# Patient Record
Sex: Male | Born: 1955
Health system: Southern US, Community
[De-identification: ages and names within clinical notes are randomized; demographics above are authoritative.]

## PROBLEM LIST (undated history)

## (undated) DIAGNOSIS — I10 Essential (primary) hypertension: Secondary | ICD-10-CM

## (undated) DIAGNOSIS — T7840XA Allergy, unspecified, initial encounter: Secondary | ICD-10-CM

## (undated) DIAGNOSIS — K819 Cholecystitis, unspecified: Secondary | ICD-10-CM

## (undated) DIAGNOSIS — E7800 Pure hypercholesterolemia, unspecified: Secondary | ICD-10-CM

## (undated) HISTORY — PX: CHOLECYSTECTOMY: SHX55

## (undated) HISTORY — PX: COLONOSCOPY: SHX174

## (undated) HISTORY — PX: COLONOSCOPY: SHX5424

## (undated) HISTORY — DX: Allergy, unspecified, initial encounter: T78.40XA

---

## 2016-11-12 DIAGNOSIS — K819 Cholecystitis, unspecified: Secondary | ICD-10-CM

## 2016-11-12 HISTORY — DX: Cholecystitis, unspecified: K81.9

## 2016-11-17 ENCOUNTER — Inpatient Hospital Stay (HOSPITAL_COMMUNITY): Payer: BLUE CROSS/BLUE SHIELD

## 2016-11-17 ENCOUNTER — Inpatient Hospital Stay (HOSPITAL_COMMUNITY)
Admission: EM | Admit: 2016-11-17 | Discharge: 2016-11-22 | DRG: 417 | Disposition: A | Discharge status: Home / Self Care | Payer: BLUE CROSS/BLUE SHIELD | Attending: Surgery | Admitting: Surgery

## 2016-11-17 ENCOUNTER — Emergency Department (HOSPITAL_COMMUNITY): Payer: BLUE CROSS/BLUE SHIELD

## 2016-11-17 ENCOUNTER — Encounter (HOSPITAL_COMMUNITY): Payer: Self-pay | Admitting: *Deleted

## 2016-11-17 DIAGNOSIS — R1011 Right upper quadrant pain: Secondary | ICD-10-CM | POA: Diagnosis present

## 2016-11-17 DIAGNOSIS — R188 Other ascites: Secondary | ICD-10-CM | POA: Diagnosis present

## 2016-11-17 DIAGNOSIS — I10 Essential (primary) hypertension: Secondary | ICD-10-CM | POA: Diagnosis present

## 2016-11-17 DIAGNOSIS — E7800 Pure hypercholesterolemia, unspecified: Secondary | ICD-10-CM | POA: Diagnosis present

## 2016-11-17 DIAGNOSIS — K8000 Calculus of gallbladder with acute cholecystitis without obstruction: Secondary | ICD-10-CM | POA: Diagnosis present

## 2016-11-17 DIAGNOSIS — K567 Ileus, unspecified: Secondary | ICD-10-CM | POA: Diagnosis not present

## 2016-11-17 DIAGNOSIS — R140 Abdominal distension (gaseous): Secondary | ICD-10-CM

## 2016-11-17 DIAGNOSIS — R7401 Elevation of levels of liver transaminase levels: Secondary | ICD-10-CM

## 2016-11-17 DIAGNOSIS — K8020 Calculus of gallbladder without cholecystitis without obstruction: Secondary | ICD-10-CM | POA: Diagnosis present

## 2016-11-17 DIAGNOSIS — K851 Biliary acute pancreatitis without necrosis or infection: Secondary | ICD-10-CM | POA: Diagnosis present

## 2016-11-17 DIAGNOSIS — R740 Nonspecific elevation of levels of transaminase and lactic acid dehydrogenase [LDH]: Secondary | ICD-10-CM

## 2016-11-17 DIAGNOSIS — Z5309 Procedure and treatment not carried out because of other contraindication: Secondary | ICD-10-CM

## 2016-11-17 HISTORY — DX: Cholecystitis, unspecified: K81.9

## 2016-11-17 HISTORY — DX: Pure hypercholesterolemia, unspecified: E78.00

## 2016-11-17 HISTORY — DX: Essential (primary) hypertension: I10

## 2016-11-17 LAB — URINALYSIS, ROUTINE W REFLEX MICROSCOPIC
BACTERIA UA: NONE SEEN
BILIRUBIN URINE: NEGATIVE
Glucose, UA: NEGATIVE mg/dL
HGB URINE DIPSTICK: NEGATIVE
KETONES UR: NEGATIVE mg/dL
LEUKOCYTES UA: NEGATIVE
NITRITE: NEGATIVE
PH: 7 (ref 5.0–8.0)
Protein, ur: 30 mg/dL — AB
SPECIFIC GRAVITY, URINE: 1.026 (ref 1.005–1.030)
SQUAMOUS EPITHELIAL / LPF: NONE SEEN

## 2016-11-17 LAB — COMPREHENSIVE METABOLIC PANEL
ALT: 222 U/L — ABNORMAL HIGH (ref 17–63)
ANION GAP: 9 (ref 5–15)
AST: 259 U/L — ABNORMAL HIGH (ref 15–41)
Albumin: 4.6 g/dL (ref 3.5–5.0)
Alkaline Phosphatase: 91 U/L (ref 38–126)
BILIRUBIN TOTAL: 2.5 mg/dL — AB (ref 0.3–1.2)
BUN: 17 mg/dL (ref 6–20)
CHLORIDE: 105 mmol/L (ref 101–111)
CO2: 24 mmol/L (ref 22–32)
Calcium: 9.2 mg/dL (ref 8.9–10.3)
Creatinine, Ser: 1.17 mg/dL (ref 0.61–1.24)
Glucose, Bld: 158 mg/dL — ABNORMAL HIGH (ref 65–99)
POTASSIUM: 4.1 mmol/L (ref 3.5–5.1)
Sodium: 138 mmol/L (ref 135–145)
TOTAL PROTEIN: 7 g/dL (ref 6.5–8.1)

## 2016-11-17 LAB — LIPASE, BLOOD: LIPASE: 41 U/L (ref 11–51)

## 2016-11-17 LAB — I-STAT TROPONIN, ED: TROPONIN I, POC: 0.01 ng/mL (ref 0.00–0.08)

## 2016-11-17 LAB — CBC
HEMATOCRIT: 45.5 % (ref 39.0–52.0)
Hemoglobin: 16.1 g/dL (ref 13.0–17.0)
MCH: 32.1 pg (ref 26.0–34.0)
MCHC: 35.4 g/dL (ref 30.0–36.0)
MCV: 90.6 fL (ref 78.0–100.0)
Platelets: 193 10*3/uL (ref 150–400)
RBC: 5.02 MIL/uL (ref 4.22–5.81)
RDW: 12.8 % (ref 11.5–15.5)
WBC: 10.3 10*3/uL (ref 4.0–10.5)

## 2016-11-17 MED ORDER — PROMETHAZINE HCL 25 MG/ML IJ SOLN
12.5000 mg | Freq: Once | INTRAMUSCULAR | Status: AC
  Administered 2016-11-18: 12.5 mg via INTRAVENOUS
  Filled 2016-11-17: qty 1

## 2016-11-17 MED ORDER — KCL IN DEXTROSE-NACL 20-5-0.45 MEQ/L-%-% IV SOLN
INTRAVENOUS | Status: DC
Start: 2016-11-17 — End: 2016-11-18
  Administered 2016-11-17 – 2016-11-18 (×2): via INTRAVENOUS
  Filled 2016-11-17 (×4): qty 1000

## 2016-11-17 MED ORDER — METOPROLOL TARTRATE 5 MG/5ML IV SOLN
5.0000 mg | Freq: Four times a day (QID) | INTRAVENOUS | Status: DC | PRN
  Administered 2016-11-21: 5 mg via INTRAVENOUS
  Filled 2016-11-17: qty 5

## 2016-11-17 MED ORDER — ACETAMINOPHEN 325 MG PO TABS
650.0000 mg | ORAL_TABLET | Freq: Four times a day (QID) | ORAL | Status: DC | PRN
  Administered 2016-11-17 – 2016-11-21 (×5): 650 mg via ORAL
  Filled 2016-11-17 (×5): qty 2

## 2016-11-17 MED ORDER — ONDANSETRON HCL 4 MG/2ML IJ SOLN
4.0000 mg | Freq: Four times a day (QID) | INTRAMUSCULAR | Status: DC | PRN
  Administered 2016-11-17: 4 mg via INTRAVENOUS
  Filled 2016-11-17: qty 2

## 2016-11-17 MED ORDER — PANTOPRAZOLE SODIUM 40 MG IV SOLR
40.0000 mg | Freq: Every day | INTRAVENOUS | Status: DC
  Administered 2016-11-17: 40 mg via INTRAVENOUS
  Filled 2016-11-17: qty 40

## 2016-11-17 MED ORDER — ONDANSETRON 4 MG PO TBDP: 4.0000 mg | ORAL_TABLET | Freq: Four times a day (QID) | ORAL | Status: DC | PRN

## 2016-11-17 MED ORDER — MORPHINE SULFATE (PF) 4 MG/ML IV SOLN
2.0000 mg | INTRAVENOUS | Status: DC | PRN
  Administered 2016-11-17 – 2016-11-20 (×23): 4 mg via INTRAVENOUS
  Administered 2016-11-20: 2 mg via INTRAVENOUS
  Administered 2016-11-20 – 2016-11-21 (×6): 4 mg via INTRAVENOUS
  Filled 2016-11-17 (×31): qty 1

## 2016-11-17 MED ORDER — SODIUM CHLORIDE 0.9 % IV BOLUS (SEPSIS)
1000.0000 mL | Freq: Once | INTRAVENOUS | Status: AC
  Administered 2016-11-17: 1000 mL via INTRAVENOUS

## 2016-11-17 MED ORDER — ACETAMINOPHEN 650 MG RE SUPP: 650.0000 mg | Freq: Four times a day (QID) | RECTAL | Status: DC | PRN

## 2016-11-17 MED ORDER — MORPHINE SULFATE (PF) 4 MG/ML IV SOLN
4.0000 mg | Freq: Once | INTRAVENOUS | Status: AC
  Administered 2016-11-17: 4 mg via INTRAVENOUS
  Filled 2016-11-17: qty 1

## 2016-11-17 MED ORDER — ONDANSETRON HCL 4 MG/2ML IJ SOLN
4.0000 mg | Freq: Once | INTRAMUSCULAR | Status: AC
  Administered 2016-11-17: 4 mg via INTRAVENOUS
  Filled 2016-11-17: qty 2

## 2016-11-17 NOTE — H&P (Signed)
[facility] Admission Note  Tracy Smith 1955-06-03  [id number].    Requesting MD: Julianne Rice MD Chief Complaint/Reason for Consult: Abdominal pain HPI:  Patient is a 61 y.o. Male with PMH significant for HTN and HLD who presented to [facility] with abdominal pain, nausea and vomiting this AM. He has been having episodes of abdominal pain after eating for the last 2-3 mos. Over the last 6 weeks it has been happening more often and the pain has been lasting longer when it happens. Patient describes pain as sharp and radiating from the midepigastric area to RUQ. Has been taking nexium to try to relieve symptoms without success. Notices pain is usually after eating a meal out. Associated nausea, vomiting, diaphoresis. Denies fever, chills, heartburn, hematemesis, diarrhea, constipation, weakness, fatigue, weight loss. Patient reports occasional alcohol use (1-2 drinks every few weeks), never smoked, no illicit drug use. No allergies to any medications. Last time patient had anything to eat or drink was around 9 PM last night. Currently patient is without pain or nausea.   Patient does not report any previous abdominal surgeries. Not on any blood thinning medications.   Patient did report some chest pain on presenting to ED, cardiac workup in the ED was negative .   ROS: Review of Systems  Constitutional: Positive for diaphoresis. Negative for chills, fever, malaise/fatigue and weight loss.  Respiratory: Negative for shortness of breath and wheezing.   Cardiovascular: Positive for chest pain. Negative for palpitations and leg swelling.  Gastrointestinal: Positive for abdominal pain (mid epigastric and RUQ), nausea and vomiting. Negative for blood in stool, constipation, diarrhea, heartburn and melena.  Genitourinary: Negative for dysuria, frequency and urgency.  Skin: Negative for itching and rash.  Neurological: Negative for dizziness and loss of consciousness.  All other systems  reviewed and are negative.   No family history on file.  Past Medical History:  Diagnosis Date  . High cholesterol   . Hypertension     History reviewed. No pertinent surgical history.  Social History:  reports that he has never smoked. He has never used smokeless tobacco. He reports that he does not drink alcohol or use drugs.  Allergies: No Known Allergies   (Not in a hospital admission)  Blood pressure 105/87, pulse 67, temperature 98.2 F (36.8 C), temperature source Oral, resp. rate 18, height 5' 8.5" (1.74 m), weight 96.2 kg (212 lb), SpO2 97 %. Physical Exam: Physical Exam  Constitutional: He is oriented to person, place, and time. He appears well-developed and well-nourished. He is cooperative.  Non-toxic appearance. No distress.  HENT:  Head: Normocephalic and atraumatic.  Right Ear: External ear normal.  Left Ear: External ear normal.  Nose: Nose normal.  Mouth/Throat: Oropharynx is clear and moist and mucous membranes are normal.  Eyes: Conjunctivae and lids are normal. No scleral icterus.  Pupils equal and round.   Neck: Trachea normal and normal range of motion. Neck supple.  Cardiovascular: Normal rate, regular rhythm, S1 normal and S2 normal.   Pulses:      Radial pulses are 2+ on the right side, and 2+ on the left side.  Pulmonary/Chest: Effort normal and breath sounds normal. He has no decreased breath sounds. He has no wheezes. He has no rhonchi. He has no rales.  Abdominal: Soft. Normal appearance and bowel sounds are normal. He exhibits no shifting dullness and no distension. There is no hepatosplenomegaly. There is no tenderness. There is no rigidity, no rebound, no guarding and negative Murphy's sign. No  hernia.  Musculoskeletal: Normal range of motion.  No gross deformities or edema  Neurological: He is alert and oriented to person, place, and time. He has normal strength. No sensory deficit.  Skin: Skin is warm, dry and intact. He is not diaphoretic.  No pallor.  Psychiatric: He has a normal mood and affect. His behavior is normal. Judgment normal.    Results for orders placed or performed during the hospital encounter of 11/17/16 (from the past 48 hour(s))  Urinalysis, Routine w reflex microscopic     Status: Abnormal   Collection Time: 11/17/16  8:39 AM  Result Value Ref Range   Color, Urine AMBER (A) YELLOW    Comment: BIOCHEMICALS MAY BE AFFECTED BY COLOR   APPearance CLEAR CLEAR   Specific Gravity, Urine 1.026 1.005 - 1.030   pH 7.0 5.0 - 8.0   Glucose, UA NEGATIVE NEGATIVE mg/dL   Hgb urine dipstick NEGATIVE NEGATIVE   Bilirubin Urine NEGATIVE NEGATIVE   Ketones, ur NEGATIVE NEGATIVE mg/dL   Protein, ur 30 (A) NEGATIVE mg/dL   Nitrite NEGATIVE NEGATIVE   Leukocytes, UA NEGATIVE NEGATIVE   RBC / HPF 0-5 0 - 5 RBC/hpf   WBC, UA 0-5 0 - 5 WBC/hpf   Bacteria, UA NONE SEEN NONE SEEN   Squamous Epithelial / LPF NONE SEEN NONE SEEN   Mucous PRESENT   Lipase, blood     Status: None   Collection Time: 11/17/16  9:02 AM  Result Value Ref Range   Lipase 41 11 - 51 U/L  Comprehensive metabolic panel     Status: Abnormal   Collection Time: 11/17/16  9:02 AM  Result Value Ref Range   Sodium 138 135 - 145 mmol/L   Potassium 4.1 3.5 - 5.1 mmol/L   Chloride 105 101 - 111 mmol/L   CO2 24 22 - 32 mmol/L   Glucose, Bld 158 (H) 65 - 99 mg/dL   BUN 17 6 - 20 mg/dL   Creatinine, Ser 1.17 0.61 - 1.24 mg/dL   Calcium 9.2 8.9 - 10.3 mg/dL   Total Protein 7.0 6.5 - 8.1 g/dL   Albumin 4.6 3.5 - 5.0 g/dL   AST 259 (H) 15 - 41 U/L   ALT 222 (H) 17 - 63 U/L   Alkaline Phosphatase 91 38 - 126 U/L   Total Bilirubin 2.5 (H) 0.3 - 1.2 mg/dL   GFR calc non Af Amer >60 >60 mL/min   GFR calc Af Amer >60 >60 mL/min    Comment: (NOTE) The eGFR has been calculated using the CKD EPI equation. This calculation has not been validated in all clinical situations. eGFR's persistently <60 mL/min signify possible Chronic Kidney Disease.    Anion gap 9  5 - 15  CBC     Status: None   Collection Time: 11/17/16  9:02 AM  Result Value Ref Range   WBC 10.3 4.0 - 10.5 K/uL   RBC 5.02 4.22 - 5.81 MIL/uL   Hemoglobin 16.1 13.0 - 17.0 g/dL   HCT 45.5 39.0 - 52.0 %   MCV 90.6 78.0 - 100.0 fL   MCH 32.1 26.0 - 34.0 pg   MCHC 35.4 30.0 - 36.0 g/dL   RDW 12.8 11.5 - 15.5 %   Platelets 193 150 - 400 K/uL  I-stat troponin, ED     Status: None   Collection Time: 11/17/16  9:08 AM  Result Value Ref Range   Troponin i, poc 0.01 0.00 - 0.08 ng/mL   Comment 3  Comment: Due to the release kinetics of cTnI, a negative result within the first hours of the onset of symptoms does not rule out myocardial infarction with certainty. If myocardial infarction is still suspected, repeat the test at appropriate intervals.    US Abdomen Limited  Result Date: 11/17/2016 CLINICAL DATA:  Right upper quadrant pain. EXAM: ULTRASOUND ABDOMEN LIMITED RIGHT UPPER QUADRANT COMPARISON:  None. FINDINGS: Gallbladder: The gallbladder is normally distended. There is layering gallbladder sludge. The gallbladder wall thickness measures 2 mm, which is normal. However there is a suggestion of tiny amount of pericholecystic fluid. This sonographic Murphy's sign was recorded is negative. Common bile duct: Diameter: 6 mm Liver: No focal lesion identified. Diffusely heterogeneous echotexture of the liver. IMPRESSION: Gallbladder sludge without evidence of abnormal distention or gallbladder wall thickening. There is however a suggestion of tiny amount of pericholecystic fluid, which may indicate early inflammatory changes. Sonographic Murphy's sign was recorded is negative. Diffusely heterogeneous echotexture of the liver, usually associated with hepatic fibrosis. Electronically Signed   By: Fidela Salisbury M.D.   On: 11/17/2016 10:03      Assessment/Plan Symptomatic Cholelithiasis w/ elevated LFTs  - [place]: GB sludge without evidence of acute cholecystitis - AST/ALT:  259/222; Tbili: 2.5 - Patient is afebrile and clinical exam unimpressive, WBC 10.3 - not starting on abx at this point - Consulted GI for consideration of MRCP - Admit, NPO, IVF, pain control - will plan for laparoscopic cholecystectomy pending results of MRCP  Brigid Re, [facility] 11/17/2016, 11:19 AM Pager: [phone] Consults: [phone] Mon-Fri 7:00 am-4:30 pm Sat-Sun 7:00 am-11:30 am

## 2016-11-17 NOTE — ED Provider Notes (Signed)
[facility] DEPT Provider Note   CSN: [id number] Arrival date & time: 11/17/16  0814     History   Chief Complaint No chief complaint on file.   HPI Tracy Smith is a 61 y.o. male.  HPI Patient presents with several weeks of episodic upper abdominal pain. Seems to be associated with eating out, mostly burgers and fries. Associated with nausea and vomiting. Patient has been on Nexium prescribed by primary physician. States he ate [place] food last night. He woke up at 1 AM with severe upper abdominal pain radiating to his back. States he was doubled over in pain. Endorse nausea and one episode of vomiting. There was no gross blood or coffee-ground substance in the emesis. Had a normal bowel movement last night without gross blood or melanotic stools. States the pain is improved significantly. Now rates pain a 3/10. No previous abdominal surgeries. Denies any chest pain or shortness of breath. Denies NSAID use or drinking alcohol. Past Medical History:  Diagnosis Date  . High cholesterol   . Hypertension     Patient Active Problem List   Diagnosis Date Noted  . Symptomatic cholelithiasis 11/17/2016    History reviewed. No pertinent surgical history.     Home Medications    Prior to Admission medications   Medication Sig Start Date End Date Taking? Authorizing Provider  amLODipine-benazepril (LOTREL) 5-40 MG capsule Take 1 capsule by mouth every evening.   Yes [provider]  aspirin EC 81 MG tablet Take 81 mg by mouth every evening.    Yes [provider]  esomeprazole (NEXIUM) 40 MG capsule Take 40 mg by mouth every morning.    Yes [provider]  montelukast (SINGULAIR) 10 MG tablet Take 10 mg by mouth every evening.   Yes [provider]  simvastatin (ZOCOR) 20 MG tablet Take 20 mg by mouth every evening.    Yes [provider]    Family History No family history on file.  Social History Social History  Substance Use  Topics  . Smoking status: Never Smoker  . Smokeless tobacco: Never Used  . Alcohol use No     Allergies   Patient has no known allergies.   Review of Systems Review of Systems  Constitutional: Positive for diaphoresis. Negative for chills and fever.  Respiratory: Negative for shortness of breath.   Cardiovascular: Negative for chest pain, palpitations and leg swelling.  Gastrointestinal: Positive for abdominal pain, nausea and vomiting. Negative for blood in stool, constipation and diarrhea.  Genitourinary: Negative for dysuria, flank pain, frequency and hematuria.  Musculoskeletal: Positive for back pain. Negative for myalgias, neck pain and neck stiffness.  Skin: Negative for rash and wound.  Neurological: Negative for dizziness, weakness, light-headedness, numbness and headaches.  All other systems reviewed and are negative.    Physical Exam Updated Vital Signs BP 112/61 (BP Location: Right Arm)   Pulse 70   Temp 98.2 F (36.8 C) (Oral)   Resp 19   Ht 5' 8.5" (1.74 m)   Wt 96.2 kg (212 lb)   SpO2 95%   BMI 31.77 kg/m   Physical Exam  Constitutional: He is oriented to person, place, and time. He appears well-developed and well-nourished. No distress.  HENT:  Head: Normocephalic and atraumatic.  Mouth/Throat: Oropharynx is clear and moist. No oropharyngeal exudate.  Eyes: EOM are normal. Pupils are equal, round, and reactive to light.  Neck: Normal range of motion. Neck supple.  Cardiovascular: Normal rate and regular rhythm.  Exam  reveals no gallop and no friction rub.   No murmur heard. Pulmonary/Chest: Effort normal and breath sounds normal. No respiratory distress. He has no wheezes. He has no rales. He exhibits no tenderness.  Abdominal: Soft. Bowel sounds are normal. There is tenderness. There is no rebound and no guarding.  Patient has right upper quadrant and epigastric tenderness with palpation. There is no rebound or guarding.  Musculoskeletal: Normal  range of motion. He exhibits no edema or tenderness.  No CVA tenderness bilaterally. No midline thoracic or lumbar tenderness.  Neurological: He is alert and oriented to person, place, and time.    Moving all extremeties without deficit. Sensation fully intact.  Skin: Skin is warm and dry. No rash noted. No erythema.  Psychiatric: He has a normal mood and affect. His behavior is normal.  Nursing note and vitals reviewed.    ED Treatments / Results  Labs (all labs ordered are listed, but only abnormal results are displayed) Labs Reviewed  COMPREHENSIVE METABOLIC PANEL - Abnormal; Notable for the following:       Result Value   Glucose, Bld 158 (*)    AST 259 (*)    ALT 222 (*)    Total Bilirubin 2.5 (*)    All other components within normal limits  URINALYSIS, ROUTINE W REFLEX MICROSCOPIC - Abnormal; Notable for the following:    Color, Urine AMBER (*)    Protein, ur 30 (*)    All other components within normal limits  LIPASE, BLOOD  CBC  HIV ANTIBODY (ROUTINE TESTING)  I-STAT TROPOININ, ED    EKG  EKG Interpretation  Date/Time:  Friday November 17 2016 08:19:55 EDT Ventricular Rate:  76 PR Interval:  158 QRS Duration: 80 QT Interval:  372 QTC Calculation: 418 R Axis:   34 Text Interpretation:  Normal sinus rhythm Low voltage QRS Inferior infarct , age undetermined Abnormal ECG Confirmed by Lita Mains  MD, Selenne Coggin ([id number]) on 11/17/2016 8:37:45 AM       Radiology US Abdomen Limited  Result Date: 11/17/2016 CLINICAL DATA:  Right upper quadrant pain. EXAM: ULTRASOUND ABDOMEN LIMITED RIGHT UPPER QUADRANT COMPARISON:  None. FINDINGS: Gallbladder: The gallbladder is normally distended. There is layering gallbladder sludge. The gallbladder wall thickness measures 2 mm, which is normal. However there is a suggestion of tiny amount of pericholecystic fluid. This sonographic Murphy's sign was recorded is negative. Common bile duct: Diameter: 6 mm Liver: No focal lesion identified.  Diffusely heterogeneous echotexture of the liver. IMPRESSION: Gallbladder sludge without evidence of abnormal distention or gallbladder wall thickening. There is however a suggestion of tiny amount of pericholecystic fluid, which may indicate early inflammatory changes. Sonographic Murphy's sign was recorded is negative. Diffusely heterogeneous echotexture of the liver, usually associated with hepatic fibrosis. Electronically Signed   By: Fidela Salisbury M.D.   On: 11/17/2016 10:03    Procedures Procedures (including critical care time)  Medications Ordered in ED Medications  dextrose 5 % and 0.45 % NaCl with KCl 20 mEq/L infusion (not administered)  acetaminophen (TYLENOL) tablet 650 mg (not administered)    Or  acetaminophen (TYLENOL) suppository 650 mg (not administered)  morphine 4 MG/ML injection 2-4 mg (not administered)  ondansetron (ZOFRAN-ODT) disintegrating tablet 4 mg (not administered)    Or  ondansetron (ZOFRAN) injection 4 mg (not administered)  pantoprazole (PROTONIX) injection 40 mg (not administered)  metoprolol tartrate (LOPRESSOR) injection 5 mg (not administered)  morphine 4 MG/ML injection 4 mg (4 mg Intravenous Given 11/17/16 0907)  ondansetron (ZOFRAN) injection  4 mg (4 mg Intravenous Given 11/17/16 0907)  sodium chloride 0.9 % bolus 1,000 mL (0 mLs Intravenous Stopped 11/17/16 1030)     Initial Impression / Assessment and Plan / ED Course  I have reviewed the triage vital signs and the nursing notes.  Pertinent labs & imaging results that were available during my care of the patient were reviewed by me and considered in my medical decision making (see chart for details).    Discussed with general surgery who will see patient in the emergency department and admit.   Final Clinical Impressions(s) / ED Diagnoses   Final diagnoses:  Symptomatic cholelithiasis    New Prescriptions New Prescriptions   No medications on file     Julianne Rice,  MD 11/17/16 1252

## 2016-11-17 NOTE — ED Triage Notes (Addendum)
Pt in c/o mid upper & lower constant abd pain onset 1 am today, reports x 1 vomiting episode, denies diarrhea, hx of similar symptoms recently, pt placed on Nexium by PCP, A&O x4

## 2016-11-17 NOTE — ED Notes (Signed)
Pt aware that urine sample is needed.  

## 2016-11-17 NOTE — Consult Note (Signed)
[facility] Consult Note  Referring Provider: No ref. provider found Primary Care Physician:  Patient, No Pcp Per Primary Gastroenterologist:  Dr.  Chief Complaint: epigastric abdominal pain HPI: Tracy Smith is an 61 y.o. white male  presents to the emergency room after developed excruciating epigastric pain with some radiation to the back and chest after eating cheeseburger and an Chinese food yesterday. He had several less severe episodes over the last 3 or 4 months for which he saw his primary physician and was given Nexium. She came to the emergency room where was found to have sludge since gallbladder pericholecystic fluid and elevated transaminases. We are asked consult to consider possibility of common bile duct stones in addition to gallstones  Past Medical History:  Diagnosis Date  . High cholesterol   . Hypertension     History reviewed. No pertinent surgical history.   (Not in a hospital admission)  Allergies: No Known Allergies  No family history on file.  Social History:  reports that he has never smoked. He has never used smokeless tobacco. He reports that he does not drink alcohol or use drugs.  Review of Systems: negative except As above   Blood pressure 112/61, pulse 70, temperature 98.2 F (36.8 C), temperature source Oral, resp. rate 19, height 5' 8.5" (1.74 m), weight 96.2 kg (212 lb), SpO2 95 %. Head: Normocephalic, without obvious abnormality, atraumatic Neck: no adenopathy, no carotid bruit, no JVD, supple, symmetrical, trachea midline and thyroid not enlarged, symmetric, no tenderness/mass/nodules Resp: clear to auscultation bilaterally Cardio: regular rate and rhythm, S1, S2 normal, no murmur, click, rub or gallop GI: Abdomen soft slightly distended with normoactive bowel sounds. No hepatomegaly masses or guarding. Extremities: extremities normal, atraumatic, no cyanosis or edema  Results for orders placed or performed during the hospital  encounter of 11/17/16 (from the past 48 hour(s))  Urinalysis, Routine w reflex microscopic     Status: Abnormal   Collection Time: 11/17/16  8:39 AM  Result Value Ref Range   Color, Urine AMBER (A) YELLOW    Comment: BIOCHEMICALS MAY BE AFFECTED BY COLOR   APPearance CLEAR CLEAR   Specific Gravity, Urine 1.026 1.005 - 1.030   pH 7.0 5.0 - 8.0   Glucose, UA NEGATIVE NEGATIVE mg/dL   Hgb urine dipstick NEGATIVE NEGATIVE   Bilirubin Urine NEGATIVE NEGATIVE   Ketones, ur NEGATIVE NEGATIVE mg/dL   Protein, ur 30 (A) NEGATIVE mg/dL   Nitrite NEGATIVE NEGATIVE   Leukocytes, UA NEGATIVE NEGATIVE   RBC / HPF 0-5 0 - 5 RBC/hpf   WBC, UA 0-5 0 - 5 WBC/hpf   Bacteria, UA NONE SEEN NONE SEEN   Squamous Epithelial / LPF NONE SEEN NONE SEEN   Mucous PRESENT   Lipase, blood     Status: None   Collection Time: 11/17/16  9:02 AM  Result Value Ref Range   Lipase 41 11 - 51 U/L  Comprehensive metabolic panel     Status: Abnormal   Collection Time: 11/17/16  9:02 AM  Result Value Ref Range   Sodium 138 135 - 145 mmol/L   Potassium 4.1 3.5 - 5.1 mmol/L   Chloride 105 101 - 111 mmol/L   CO2 24 22 - 32 mmol/L   Glucose, Bld 158 (H) 65 - 99 mg/dL   BUN 17 6 - 20 mg/dL   Creatinine, Ser 1.17 0.61 - 1.24 mg/dL   Calcium 9.2 8.9 - 10.3 mg/dL   Total Protein 7.0 6.5 - 8.1 g/dL     Albumin 4.6 3.5 - 5.0 g/dL   AST 259 (H) 15 - 41 U/L   ALT 222 (H) 17 - 63 U/L   Alkaline Phosphatase 91 38 - 126 U/L   Total Bilirubin 2.5 (H) 0.3 - 1.2 mg/dL   GFR calc non Af Amer >60 >60 mL/min   GFR calc Af Amer >60 >60 mL/min    Comment: (NOTE) The eGFR has been calculated using the CKD EPI equation. This calculation has not been validated in all clinical situations. eGFR's persistently <60 mL/min signify possible Chronic Kidney Disease.    Anion gap 9 5 - 15  CBC     Status: None   Collection Time: 11/17/16  9:02 AM  Result Value Ref Range   WBC 10.3 4.0 - 10.5 K/uL   RBC 5.02 4.22 - 5.81 MIL/uL   Hemoglobin  16.1 13.0 - 17.0 g/dL   HCT 45.5 39.0 - 52.0 %   MCV 90.6 78.0 - 100.0 fL   MCH 32.1 26.0 - 34.0 pg   MCHC 35.4 30.0 - 36.0 g/dL   RDW 12.8 11.5 - 15.5 %   Platelets 193 150 - 400 K/uL  I-stat troponin, ED     Status: None   Collection Time: 11/17/16  9:08 AM  Result Value Ref Range   Troponin i, poc 0.01 0.00 - 0.08 ng/mL   Comment 3            Comment: Due to the release kinetics of cTnI, a negative result within the first hours of the onset of symptoms does not rule out myocardial infarction with certainty. If myocardial infarction is still suspected, repeat the test at appropriate intervals.    US Abdomen Limited  Result Date: 11/17/2016 CLINICAL DATA:  Right upper quadrant pain. EXAM: ULTRASOUND ABDOMEN LIMITED RIGHT UPPER QUADRANT COMPARISON:  None. FINDINGS: Gallbladder: The gallbladder is normally distended. There is layering gallbladder sludge. The gallbladder wall thickness measures 2 mm, which is normal. However there is a suggestion of tiny amount of pericholecystic fluid. This sonographic Murphy's sign was recorded is negative. Common bile duct: Diameter: 6 mm Liver: No focal lesion identified. Diffusely heterogeneous echotexture of the liver. IMPRESSION: Gallbladder sludge without evidence of abnormal distention or gallbladder wall thickening. There is however a suggestion of tiny amount of pericholecystic fluid, which may indicate early inflammatory changes. Sonographic Murphy's sign was recorded is negative. Diffusely heterogeneous echotexture of the liver, usually associated with hepatic fibrosis. Electronically Signed   By: Fidela Salisbury M.D.   On: 11/17/2016 10:03    Assessment: 1. Gall Bladder sludge with possible cholecystitis 2. Elevated LFTs raising the possibility of common bile duct stones. Plan:  We'll obtain MRCP and recheck liver function tests in the morning and determine whether ERCP warranted. We'll follow with you. Toluwanimi Radebaugh C 11/17/2016, 11:57  AM  Pager [phone] If no answer or after 5 PM call [phone]

## 2016-11-17 NOTE — ED Notes (Signed)
Pt ambulatory to restroom

## 2016-11-17 NOTE — ED Notes (Signed)
Started taking Nexium 6 weeks ago (40mg ) and changed diet, but started having abd pain again last night, nauseated, had normal bowel movement last night, vomited this morning. Now pain is 3-4/10. Has had pain epigastric area in past.  Took kaopectate on Tuesday or Wednesday for similar discomfort--  Became diaphoretic during episode approx 3am. Last meal, last night, chinese food.

## 2016-11-17 NOTE — Progress Notes (Signed)
Assumed care of patient at 1600 after receiving handoff report from previous nurse. Patient has been experiencing 2-3 bouts of nausea and vomiting.patient was medicated for pain and nausea (see emar). Currently resting in bed. Asked to call nurse for any concerns.

## 2016-11-18 LAB — COMPREHENSIVE METABOLIC PANEL
ALBUMIN: 3.8 g/dL (ref 3.5–5.0)
ALK PHOS: 105 U/L (ref 38–126)
ALT: 354 U/L — ABNORMAL HIGH (ref 17–63)
ALT: 344 U/L — ABNORMAL HIGH (ref 17–63)
ANION GAP: 10 (ref 5–15)
AST: 223 U/L — AB (ref 15–41)
AST: 265 U/L — AB (ref 15–41)
Albumin: 3.7 g/dL (ref 3.5–5.0)
Alkaline Phosphatase: 104 U/L (ref 38–126)
Anion gap: 8 (ref 5–15)
BILIRUBIN TOTAL: 3.9 mg/dL — AB (ref 0.3–1.2)
BUN: 19 mg/dL (ref 6–20)
BUN: 17 mg/dL (ref 6–20)
CALCIUM: 8.6 mg/dL — AB (ref 8.9–10.3)
CHLORIDE: 104 mmol/L (ref 101–111)
CO2: 24 mmol/L (ref 22–32)
CO2: 22 mmol/L (ref 22–32)
Calcium: 8.6 mg/dL — ABNORMAL LOW (ref 8.9–10.3)
Chloride: 105 mmol/L (ref 101–111)
Creatinine, Ser: 1.43 mg/dL — ABNORMAL HIGH (ref 0.61–1.24)
Creatinine, Ser: 1.37 mg/dL — ABNORMAL HIGH (ref 0.61–1.24)
GFR calc Af Amer: >60 mL/min (ref >60)
GFR calc Af Amer: >60 mL/min (ref >60)
GFR calc non Af Amer: 52 mL/min — ABNORMAL LOW (ref >60)
GFR, EST NON AFRICAN AMERICAN: 55 mL/min — AB (ref >60)
GLUCOSE: 187 mg/dL — AB (ref 65–99)
GLUCOSE: 219 mg/dL — AB (ref 65–99)
POTASSIUM: 4.7 mmol/L (ref 3.5–5.1)
POTASSIUM: 4.4 mmol/L (ref 3.5–5.1)
SODIUM: 136 mmol/L (ref 135–145)
Sodium: 137 mmol/L (ref 135–145)
TOTAL PROTEIN: 6.1 g/dL — AB (ref 6.5–8.1)
TOTAL PROTEIN: 6.3 g/dL — AB (ref 6.5–8.1)
Total Bilirubin: 5.1 mg/dL — ABNORMAL HIGH (ref 0.3–1.2)

## 2016-11-18 LAB — CBC
HEMATOCRIT: 46.9 % (ref 39.0–52.0)
Hemoglobin: 16.5 g/dL (ref 13.0–17.0)
MCH: 32.1 pg (ref 26.0–34.0)
MCHC: 35.2 g/dL (ref 30.0–36.0)
MCV: 91.2 fL (ref 78.0–100.0)
PLATELETS: 192 10*3/uL (ref 150–400)
RBC: 5.14 MIL/uL (ref 4.22–5.81)
RDW: 13.1 % (ref 11.5–15.5)
WBC: 20 10*3/uL — AB (ref 4.0–10.5)

## 2016-11-18 LAB — HIV ANTIBODY (ROUTINE TESTING W REFLEX): HIV Screen 4th Generation wRfx: NONREACTIVE

## 2016-11-18 LAB — TRIGLYCERIDES: Triglycerides: 67 mg/dL (ref <150)

## 2016-11-18 LAB — LIPASE, BLOOD: Lipase: 824 U/L — ABNORMAL HIGH (ref 11–51)

## 2016-11-18 MED ORDER — PANTOPRAZOLE SODIUM 40 MG IV SOLR
40.0000 mg | Freq: Two times a day (BID) | INTRAVENOUS | Status: DC
  Administered 2016-11-18 – 2016-11-21 (×7): 40 mg via INTRAVENOUS
  Filled 2016-11-18 (×7): qty 40

## 2016-11-18 MED ORDER — PIPERACILLIN-TAZOBACTAM 3.375 G IVPB
3.3750 g | Freq: Three times a day (TID) | INTRAVENOUS | Status: DC
  Administered 2016-11-18 – 2016-11-22 (×12): 3.375 g via INTRAVENOUS
  Filled 2016-11-18 (×14): qty 50

## 2016-11-18 MED ORDER — SODIUM CHLORIDE 0.9 % IV SOLN
INTRAVENOUS | Status: DC
  Administered 2016-11-18 – 2016-11-21 (×13): via INTRAVENOUS

## 2016-11-18 NOTE — Progress Notes (Signed)
[facility] Progress Note  Subjective: Worsening pain last night with some  No vomiting  Objective: Vital signs in last 24 hours: Temp:  [98.2 F (36.8 C)-98.6 F (37 C)] 98.5 F (36.9 C) (07/07 0635) Pulse Rate:  [65-78] 68 (07/07 0635) Resp:  [14-21] 18 (07/07 6767) BP: (97-147)/(61-87) 131/71 (07/07 0635) SpO2:  [95 %-98 %] 97 % (07/07 0635) Weight change:    MC:NOBSJGG a little more distended and  Lab Results: Results for orders placed or performed during the hospital encounter of 11/17/16 (from the past 24 hour(s))  HIV antibody (Routine Testing)     Status: None   Collection Time: 11/17/16  1:33 PM  Result Value Ref Range   HIV Screen 4th Generation wRfx Non Reactive Non Reactive  Comprehensive metabolic panel     Status: Abnormal   Collection Time: 11/18/16 12:24 AM  Result Value Ref Range   Sodium 136 135 - 145 mmol/L   Potassium 4.4 3.5 - 5.1 mmol/L   Chloride 104 101 - 111 mmol/L   CO2 22 22 - 32 mmol/L   Glucose, Bld 219 (H) 65 - 99 mg/dL   BUN 17 6 - 20 mg/dL   Creatinine, Ser 1.43 (H) 0.61 - 1.24 mg/dL   Calcium 8.6 (L) 8.9 - 10.3 mg/dL   Total Protein 6.3 (L) 6.5 - 8.1 g/dL   Albumin 3.8 3.5 - 5.0 g/dL   AST 265 (H) 15 - 41 U/L   ALT 354 (H) 17 - 63 U/L   Alkaline Phosphatase 104 38 - 126 U/L   Total Bilirubin 3.9 (H) 0.3 - 1.2 mg/dL   GFR calc non Af Amer 52 (L) >60 mL/min   GFR calc Af Amer >60 >60 mL/min   Anion gap 10 5 - 15  Comprehensive metabolic panel     Status: Abnormal   Collection Time: 11/18/16  4:33 AM  Result Value Ref Range   Sodium 137 135 - 145 mmol/L   Potassium 4.7 3.5 - 5.1 mmol/L   Chloride 105 101 - 111 mmol/L   CO2 24 22 - 32 mmol/L   Glucose, Bld 187 (H) 65 - 99 mg/dL   BUN 19 6 - 20 mg/dL   Creatinine, Ser 1.37 (H) 0.61 - 1.24 mg/dL   Calcium 8.6 (L) 8.9 - 10.3 mg/dL   Total Protein 6.1 (L) 6.5 - 8.1 g/dL   Albumin 3.7 3.5 - 5.0 g/dL   AST 223 (H) 15 - 41 U/L   ALT 344 (H) 17 - 63 U/L   Alkaline  Phosphatase 105 38 - 126 U/L   Total Bilirubin 5.1 (H) 0.3 - 1.2 mg/dL   GFR calc non Af Amer 55 (L) >60 mL/min   GFR calc Af Amer >60 >60 mL/min   Anion gap 8 5 - 15  CBC     Status: Abnormal   Collection Time: 11/18/16  4:33 AM  Result Value Ref Range   WBC 20.0 (H) 4.0 - 10.5 K/uL   RBC 5.14 4.22 - 5.81 MIL/uL   Hemoglobin 16.5 13.0 - 17.0 g/dL   HCT 46.9 39.0 - 52.0 %   MCV 91.2 78.0 - 100.0 fL   MCH 32.1 26.0 - 34.0 pg   MCHC 35.2 30.0 - 36.0 g/dL   RDW 13.1 11.5 - 15.5 %   Platelets 192 150 - 400 K/uL    Studies/Results: Dg Eye Foreign Body  Result Date: 11/17/2016 CLINICAL DATA:  Metal working/exposure; clearance prior to MRI EXAM: ORBITS FOR FOREIGN  BODY - 2 VIEW COMPARISON:  None. FINDINGS: There is no evidence of metallic foreign body within the orbits. No significant bone abnormality identified. IMPRESSION: No evidence of metallic foreign body within the orbits. Electronically Signed   By: Margarette Canada M.D.   On: 11/17/2016 20:14   Mr Abdomen Mrcp Wo Contrast  Result Date: 11/18/2016 CLINICAL DATA:  Gallbladder sludge and pericholecystic fluid on ultrasound, with heterogeneous liver, for further investigation. EXAM: MRI ABDOMEN WITHOUT CONTRAST  (INCLUDING MRCP) TECHNIQUE: Multiplanar multisequence MR imaging of the abdomen was performed. Heavily T2-weighted images of the biliary and pancreatic ducts were obtained, and three-dimensional MRCP images were rendered by post processing. COMPARISON:  11/17/2016 ultrasound FINDINGS: Despite efforts by the technologist and patient, motion artifact is present on today's exam and could not be eliminated. This reduces exam sensitivity and specificity. Lower chest: Unremarkable Hepatobiliary: Periportal edema but no focal hepatic parenchymal lesion is appreciated. Diffuse gallbladder wall thickening without visible gallstones. Fusiform mild prominence of the common hepatic duct at 8 mm, common bile duct 5 mm, no obvious abnormal filling defect  to the limits of resolution of today's exam which is admittedly mildly reduced in sensitivity due to motion artifact. No intrahepatic biliary dilatation. Slightly low position of the attachment of the cystic duct near the pancreatic head. Pancreas:  Peripancreatic fluid/edema. Spleen:  Unremarkable Adrenals/Urinary Tract: Adrenal glands normal. No hydronephrosis or proximal hydroureter. Mild bilateral perirenal stranding, chronicity uncertain. No focal renal lesion is identified on today' s noncontrast exam. Stomach/Bowel: There is some mild localized wall thickening along the greater curvature of the stomach body, for example on image 21/4, significance uncertain. Edema tracks around the duodenal bulb and descending duodenum. I do not see an obvious ulcer although MRI would be considered less sensitive than endoscopy or upper GI assessment. Vascular/Lymphatic: No specific abnormality is identified on today' s noncontrast assessment. Other: Perihepatic and perisplenic ascites. Peripancreatic edema and a small amount of scattered upper abdominal ascites. Musculoskeletal: Thoracic spondylosis. IMPRESSION: 1. Perihepatic and perisplenic ascites, with peripancreatic edema and edema tracking around the duodenal bulb, and descending and transverse duodenum. Typically I would expect this appearance be due to acute pancreatitis but I do note that the patient's lipase level was normal yesterday. Accordingly alternative possibilities must be considered. Given the patient's pain was acute in onset after eating, other possibilities are acute cholecystitis or perhaps a duodenal ulcer. There certainly gallbladder wall thickening, but no stones or obvious lack of patency of the cystic duct is identified. 2. There is some mild wall thickening along the greater curvature of the stomach body, for example on image 21/4, potentially from gastritis. Sessile tumor is considered less likely. 3. There is some mild fusiform prominence of  the common hepatic duct at 8 mm, but the CBD is of normal caliber. Electronically Signed   By: Van Clines M.D.   On: 11/18/2016 08:38   Mr 3d Recon At Scanner  Result Date: 11/18/2016 CLINICAL DATA:  Gallbladder sludge and pericholecystic fluid on ultrasound, with heterogeneous liver, for further investigation. EXAM: MRI ABDOMEN WITHOUT CONTRAST  (INCLUDING MRCP) TECHNIQUE: Multiplanar multisequence MR imaging of the abdomen was performed. Heavily T2-weighted images of the biliary and pancreatic ducts were obtained, and three-dimensional MRCP images were rendered by post processing. COMPARISON:  11/17/2016 ultrasound FINDINGS: Despite efforts by the technologist and patient, motion artifact is present on today's exam and could not be eliminated. This reduces exam sensitivity and specificity. Lower chest: Unremarkable Hepatobiliary: Periportal edema but no focal hepatic parenchymal  lesion is appreciated. Diffuse gallbladder wall thickening without visible gallstones. Fusiform mild prominence of the common hepatic duct at 8 mm, common bile duct 5 mm, no obvious abnormal filling defect to the limits of resolution of today's exam which is admittedly mildly reduced in sensitivity due to motion artifact. No intrahepatic biliary dilatation. Slightly low position of the attachment of the cystic duct near the pancreatic head. Pancreas:  Peripancreatic fluid/edema. Spleen:  Unremarkable Adrenals/Urinary Tract: Adrenal glands normal. No hydronephrosis or proximal hydroureter. Mild bilateral perirenal stranding, chronicity uncertain. No focal renal lesion is identified on today' s noncontrast exam. Stomach/Bowel: There is some mild localized wall thickening along the greater curvature of the stomach body, for example on image 21/4, significance uncertain. Edema tracks around the duodenal bulb and descending duodenum. I do not see an obvious ulcer although MRI would be considered less sensitive than endoscopy or upper  GI assessment. Vascular/Lymphatic: No specific abnormality is identified on today' s noncontrast assessment. Other: Perihepatic and perisplenic ascites. Peripancreatic edema and a small amount of scattered upper abdominal ascites. Musculoskeletal: Thoracic spondylosis. IMPRESSION: 1. Perihepatic and perisplenic ascites, with peripancreatic edema and edema tracking around the duodenal bulb, and descending and transverse duodenum. Typically I would expect this appearance be due to acute pancreatitis but I do note that the patient's lipase level was normal yesterday. Accordingly alternative possibilities must be considered. Given the patient's pain was acute in onset after eating, other possibilities are acute cholecystitis or perhaps a duodenal ulcer. There certainly gallbladder wall thickening, but no stones or obvious lack of patency of the cystic duct is identified. 2. There is some mild wall thickening along the greater curvature of the stomach body, for example on image 21/4, potentially from gastritis. Sessile tumor is considered less likely. 3. There is some mild fusiform prominence of the common hepatic duct at 8 mm, but the CBD is of normal caliber. Electronically Signed   By: Van Clines M.D.   On: 11/18/2016 08:38   US Abdomen Limited  Result Date: 11/17/2016 CLINICAL DATA:  Right upper quadrant pain. EXAM: ULTRASOUND ABDOMEN LIMITED RIGHT UPPER QUADRANT COMPARISON:  None. FINDINGS: Gallbladder: The gallbladder is normally distended. There is layering gallbladder sludge. The gallbladder wall thickness measures 2 mm, which is normal. However there is a suggestion of tiny amount of pericholecystic fluid. This sonographic Murphy's sign was recorded is negative. Common bile duct: Diameter: 6 mm Liver: No focal lesion identified. Diffusely heterogeneous echotexture of the liver. IMPRESSION: Gallbladder sludge without evidence of abnormal distention or gallbladder wall thickening. There is however a  suggestion of tiny amount of pericholecystic fluid, which may indicate early inflammatory changes. Sonographic Murphy's sign was recorded is negative. Diffusely heterogeneous echotexture of the liver, usually associated with hepatic fibrosis. Electronically Signed   By: Fidela Salisbury M.D.   On: 11/17/2016 10:03      Assessment: 1. Apparent pancreatitis by MRCP with normal lipase, fluid around the duodenum and gallbladder as well as perisplenic ascites. No common bile duct stone. Peptic ulcer appears less likely  Plan: 1. Continue antibiotics, PPI, supportive care, Bowel rest 2. Recheck labs in a.m.    Cortez Flippen C 11/18/2016, 9:26 AM  Pager [phone] If no answer or after 5 PM call [phone]

## 2016-11-18 NOTE — Progress Notes (Signed)
Assessment Severe epigastric abdominal pain with following possible etiologies:  Acute pancreatitis (MRCP highly supportive of this despite initial normal lipase level), Acute cholecystitis, PUDz-pain is about the same; WBC now 20k.  Plan:  BID Protonix.  Check lipase and TG level.  Empiric Zosyn.   LOS: 1 day        Chief Complaint/Subjective: Pain is about the same.  Objective: Vital signs in last 24 hours: Temp:  [98.2 F (36.8 C)-98.6 F (37 C)] 98.5 F (36.9 C) (07/07 0635) Pulse Rate:  [65-78] 68 (07/07 0635) Resp:  [14-21] 18 (07/07 0635) BP: (97-147)/(61-87) 131/71 (07/07 0635) SpO2:  [95 %-98 %] 97 % (07/07 0635) Last BM Date: 11/17/16  Intake/Output from previous day: 07/06 0701 - 07/07 0700 In: 1045 [I.V.:45; IV Piggyback:1000] Out: -  Intake/Output this shift: No intake/output data recorded.  PE: General- In NAD.  Awake and alert. Jaundice. Abdomen-soft, tender in upper abdomen  Lab Results:   Recent Labs  11/17/16 0902 11/18/16 0433  WBC 10.3 20.0*  HGB 16.1 16.5  HCT 45.5 46.9  PLT 193 192   BMET  Recent Labs  11/18/16 0024 11/18/16 0433  NA 136 137  K 4.4 4.7  CL 104 105  CO2 22 24  GLUCOSE 219* 187*  BUN 17 19  CREATININE 1.43* 1.37*  CALCIUM 8.6* 8.6*   PT/INR No results for input(s): LABPROT, INR in the last 72 hours. Comprehensive Metabolic Panel:    Component Value Date/Time   NA 137 11/18/2016 0433   NA 136 11/18/2016 0024   K 4.7 11/18/2016 0433   K 4.4 11/18/2016 0024   CL 105 11/18/2016 0433   CL 104 11/18/2016 0024   CO2 24 11/18/2016 0433   CO2 22 11/18/2016 0024   BUN 19 11/18/2016 0433   BUN 17 11/18/2016 0024   CREATININE 1.37 (H) 11/18/2016 0433   CREATININE 1.43 (H) 11/18/2016 0024   GLUCOSE 187 (H) 11/18/2016 0433   GLUCOSE 219 (H) 11/18/2016 0024   CALCIUM 8.6 (L) 11/18/2016 0433   CALCIUM 8.6 (L) 11/18/2016 0024   AST 223 (H) 11/18/2016 0433   AST 265 (H) 11/18/2016 0024   ALT 344 (H) 11/18/2016 0433    ALT 354 (H) 11/18/2016 0024   ALKPHOS 105 11/18/2016 0433   ALKPHOS 104 11/18/2016 0024   BILITOT 5.1 (H) 11/18/2016 0433   BILITOT 3.9 (H) 11/18/2016 0024   PROT 6.1 (L) 11/18/2016 0433   PROT 6.3 (L) 11/18/2016 0024   ALBUMIN 3.7 11/18/2016 0433   ALBUMIN 3.8 11/18/2016 0024     Studies/Results: Dg Eye Foreign Body  Result Date: 11/17/2016 CLINICAL DATA:  Metal working/exposure; clearance prior to MRI EXAM: ORBITS FOR FOREIGN BODY - 2 VIEW COMPARISON:  None. FINDINGS: There is no evidence of metallic foreign body within the orbits. No significant bone abnormality identified. IMPRESSION: No evidence of metallic foreign body within the orbits. Electronically Signed   By: Margarette Canada M.D.   On: 11/17/2016 20:14   Mr Abdomen Mrcp Wo Contrast  Result Date: 11/18/2016 CLINICAL DATA:  Gallbladder sludge and pericholecystic fluid on ultrasound, with heterogeneous liver, for further investigation. EXAM: MRI ABDOMEN WITHOUT CONTRAST  (INCLUDING MRCP) TECHNIQUE: Multiplanar multisequence MR imaging of the abdomen was performed. Heavily T2-weighted images of the biliary and pancreatic ducts were obtained, and three-dimensional MRCP images were rendered by post processing. COMPARISON:  11/17/2016 ultrasound FINDINGS: Despite efforts by the technologist and patient, motion artifact is present on today's exam and could not be eliminated. This  reduces exam sensitivity and specificity. Lower chest: Unremarkable Hepatobiliary: Periportal edema but no focal hepatic parenchymal lesion is appreciated. Diffuse gallbladder wall thickening without visible gallstones. Fusiform mild prominence of the common hepatic duct at 8 mm, common bile duct 5 mm, no obvious abnormal filling defect to the limits of resolution of today's exam which is admittedly mildly reduced in sensitivity due to motion artifact. No intrahepatic biliary dilatation. Slightly low position of the attachment of the cystic duct near the pancreatic head.  Pancreas:  Peripancreatic fluid/edema. Spleen:  Unremarkable Adrenals/Urinary Tract: Adrenal glands normal. No hydronephrosis or proximal hydroureter. Mild bilateral perirenal stranding, chronicity uncertain. No focal renal lesion is identified on today' s noncontrast exam. Stomach/Bowel: There is some mild localized wall thickening along the greater curvature of the stomach body, for example on image 21/4, significance uncertain. Edema tracks around the duodenal bulb and descending duodenum. I do not see an obvious ulcer although MRI would be considered less sensitive than endoscopy or upper GI assessment. Vascular/Lymphatic: No specific abnormality is identified on today' s noncontrast assessment. Other: Perihepatic and perisplenic ascites. Peripancreatic edema and a small amount of scattered upper abdominal ascites. Musculoskeletal: Thoracic spondylosis. IMPRESSION: 1. Perihepatic and perisplenic ascites, with peripancreatic edema and edema tracking around the duodenal bulb, and descending and transverse duodenum. Typically I would expect this appearance be due to acute pancreatitis but I do note that the patient's lipase level was normal yesterday. Accordingly alternative possibilities must be considered. Given the patient's pain was acute in onset after eating, other possibilities are acute cholecystitis or perhaps a duodenal ulcer. There certainly gallbladder wall thickening, but no stones or obvious lack of patency of the cystic duct is identified. 2. There is some mild wall thickening along the greater curvature of the stomach body, for example on image 21/4, potentially from gastritis. Sessile tumor is considered less likely. 3. There is some mild fusiform prominence of the common hepatic duct at 8 mm, but the CBD is of normal caliber. Electronically Signed   By: Van Clines M.D.   On: 11/18/2016 08:38   Mr 3d Recon At Scanner  Result Date: 11/18/2016 CLINICAL DATA:  Gallbladder sludge and  pericholecystic fluid on ultrasound, with heterogeneous liver, for further investigation. EXAM: MRI ABDOMEN WITHOUT CONTRAST  (INCLUDING MRCP) TECHNIQUE: Multiplanar multisequence MR imaging of the abdomen was performed. Heavily T2-weighted images of the biliary and pancreatic ducts were obtained, and three-dimensional MRCP images were rendered by post processing. COMPARISON:  11/17/2016 ultrasound FINDINGS: Despite efforts by the technologist and patient, motion artifact is present on today's exam and could not be eliminated. This reduces exam sensitivity and specificity. Lower chest: Unremarkable Hepatobiliary: Periportal edema but no focal hepatic parenchymal lesion is appreciated. Diffuse gallbladder wall thickening without visible gallstones. Fusiform mild prominence of the common hepatic duct at 8 mm, common bile duct 5 mm, no obvious abnormal filling defect to the limits of resolution of today's exam which is admittedly mildly reduced in sensitivity due to motion artifact. No intrahepatic biliary dilatation. Slightly low position of the attachment of the cystic duct near the pancreatic head. Pancreas:  Peripancreatic fluid/edema. Spleen:  Unremarkable Adrenals/Urinary Tract: Adrenal glands normal. No hydronephrosis or proximal hydroureter. Mild bilateral perirenal stranding, chronicity uncertain. No focal renal lesion is identified on today' s noncontrast exam. Stomach/Bowel: There is some mild localized wall thickening along the greater curvature of the stomach body, for example on image 21/4, significance uncertain. Edema tracks around the duodenal bulb and descending duodenum. I do not see  an obvious ulcer although MRI would be considered less sensitive than endoscopy or upper GI assessment. Vascular/Lymphatic: No specific abnormality is identified on today' s noncontrast assessment. Other: Perihepatic and perisplenic ascites. Peripancreatic edema and a small amount of scattered upper abdominal ascites.  Musculoskeletal: Thoracic spondylosis. IMPRESSION: 1. Perihepatic and perisplenic ascites, with peripancreatic edema and edema tracking around the duodenal bulb, and descending and transverse duodenum. Typically I would expect this appearance be due to acute pancreatitis but I do note that the patient's lipase level was normal yesterday. Accordingly alternative possibilities must be considered. Given the patient's pain was acute in onset after eating, other possibilities are acute cholecystitis or perhaps a duodenal ulcer. There certainly gallbladder wall thickening, but no stones or obvious lack of patency of the cystic duct is identified. 2. There is some mild wall thickening along the greater curvature of the stomach body, for example on image 21/4, potentially from gastritis. Sessile tumor is considered less likely. 3. There is some mild fusiform prominence of the common hepatic duct at 8 mm, but the CBD is of normal caliber. Electronically Signed   By: Van Clines M.D.   On: 11/18/2016 08:38   US Abdomen Limited  Result Date: 11/17/2016 CLINICAL DATA:  Right upper quadrant pain. EXAM: ULTRASOUND ABDOMEN LIMITED RIGHT UPPER QUADRANT COMPARISON:  None. FINDINGS: Gallbladder: The gallbladder is normally distended. There is layering gallbladder sludge. The gallbladder wall thickness measures 2 mm, which is normal. However there is a suggestion of tiny amount of pericholecystic fluid. This sonographic Murphy's sign was recorded is negative. Common bile duct: Diameter: 6 mm Liver: No focal lesion identified. Diffusely heterogeneous echotexture of the liver. IMPRESSION: Gallbladder sludge without evidence of abnormal distention or gallbladder wall thickening. There is however a suggestion of tiny amount of pericholecystic fluid, which may indicate early inflammatory changes. Sonographic Murphy's sign was recorded is negative. Diffusely heterogeneous echotexture of the liver, usually associated with hepatic  fibrosis. Electronically Signed   By: Fidela Salisbury M.D.   On: 11/17/2016 10:03    Anti-infectives: Anti-infectives    Start     Dose/Rate Route Frequency Ordered Stop   11/18/16 0930  piperacillin-tazobactam (ZOSYN) IVPB 3.375 g     3.375 g 12.5 mL/hr over 240 Minutes Intravenous Every 8 hours 11/18/16 0923         Sanaiya Welliver,Carsten J 11/18/2016

## 2016-11-19 LAB — CBC WITH DIFFERENTIAL/PLATELET
BASOS ABS: 0 10*3/uL (ref 0.0–0.1)
Basophils Relative: 0 %
EOS PCT: 0 %
Eosinophils Absolute: 0 10*3/uL (ref 0.0–0.7)
HEMATOCRIT: 43.3 % (ref 39.0–52.0)
HEMOGLOBIN: 15.1 g/dL (ref 13.0–17.0)
LYMPHS ABS: 1.7 10*3/uL (ref 0.7–4.0)
LYMPHS PCT: 7 %
MCH: 31.9 pg (ref 26.0–34.0)
MCHC: 34.9 g/dL (ref 30.0–36.0)
MCV: 91.5 fL (ref 78.0–100.0)
Monocytes Absolute: 2 10*3/uL — ABNORMAL HIGH (ref 0.1–1.0)
Monocytes Relative: 8 %
NEUTROS ABS: 20.7 10*3/uL — AB (ref 1.7–7.7)
Neutrophils Relative %: 85 %
Platelets: 155 10*3/uL (ref 150–400)
RBC: 4.73 MIL/uL (ref 4.22–5.81)
RDW: 13.4 % (ref 11.5–15.5)
WBC: 24.3 10*3/uL — AB (ref 4.0–10.5)

## 2016-11-19 LAB — COMPREHENSIVE METABOLIC PANEL
ALK PHOS: 92 U/L (ref 38–126)
ALT: 208 U/L — AB (ref 17–63)
AST: 76 U/L — AB (ref 15–41)
Albumin: 3 g/dL — ABNORMAL LOW (ref 3.5–5.0)
Anion gap: 8 (ref 5–15)
BILIRUBIN TOTAL: 3.2 mg/dL — AB (ref 0.3–1.2)
BUN: 16 mg/dL (ref 6–20)
CALCIUM: 7.9 mg/dL — AB (ref 8.9–10.3)
CHLORIDE: 105 mmol/L (ref 101–111)
CO2: 22 mmol/L (ref 22–32)
CREATININE: 1.1 mg/dL (ref 0.61–1.24)
Glucose, Bld: 117 mg/dL — ABNORMAL HIGH (ref 65–99)
Potassium: 3.5 mmol/L (ref 3.5–5.1)
Sodium: 135 mmol/L (ref 135–145)
TOTAL PROTEIN: 6.3 g/dL — AB (ref 6.5–8.1)

## 2016-11-19 LAB — LIPASE, BLOOD: LIPASE: 556 U/L — AB (ref 11–51)

## 2016-11-19 NOTE — Progress Notes (Signed)
[facility] Progress Note  Subjective: Pain level rated at about a 5, requiring morphine  Objective: Vital signs in last 24 hours: Temp:  [99.1 F (37.3 Smith)-100.4 F (38 Smith)] 99.4 F (37.4 Smith) (07/08 0449) Pulse Rate:  [102-105] 102 (07/08 0449) Resp:  [18-19] 18 (07/08 0449) BP: (122-127)/(68-80) 122/68 (07/08 0449) SpO2:  [96 %-97 %] 96 % (07/08 0449) Weight change:    PE: Tender mainly right upper quadrant  Lab Results: Results for orders placed or performed during the hospital encounter of 11/17/16 (from the past 24 hour(s))  CBC with Differential/Platelet     Status: Abnormal   Collection Time: 11/19/16  4:41 AM  Result Value Ref Range   WBC 24.3 (H) 4.0 - 10.5 K/uL   RBC 4.73 4.22 - 5.81 MIL/uL   Hemoglobin 15.1 13.0 - 17.0 g/dL   HCT 43.3 39.0 - 52.0 %   MCV 91.5 78.0 - 100.0 fL   MCH 31.9 26.0 - 34.0 pg   MCHC 34.9 30.0 - 36.0 g/dL   RDW 13.4 11.5 - 15.5 %   Platelets 155 150 - 400 K/uL   Neutrophils Relative % 85 %   Neutro Abs 20.7 (H) 1.7 - 7.7 K/uL   Lymphocytes Relative 7 %   Lymphs Abs 1.7 0.7 - 4.0 K/uL   Monocytes Relative 8 %   Monocytes Absolute 2.0 (H) 0.1 - 1.0 K/uL   Eosinophils Relative 0 %   Eosinophils Absolute 0.0 0.0 - 0.7 K/uL   Basophils Relative 0 %   Basophils Absolute 0.0 0.0 - 0.1 K/uL  Comprehensive metabolic panel     Status: Abnormal   Collection Time: 11/19/16  4:41 AM  Result Value Ref Range   Sodium 135 135 - 145 mmol/L   Potassium 3.5 3.5 - 5.1 mmol/L   Chloride 105 101 - 111 mmol/L   CO2 22 22 - 32 mmol/L   Glucose, Bld 117 (H) 65 - 99 mg/dL   BUN 16 6 - 20 mg/dL   Creatinine, Ser 1.10 0.61 - 1.24 mg/dL   Calcium 7.9 (L) 8.9 - 10.3 mg/dL   Total Protein 6.3 (L) 6.5 - 8.1 g/dL   Albumin 3.0 (L) 3.5 - 5.0 g/dL   AST 76 (H) 15 - 41 U/L   ALT 208 (H) 17 - 63 U/L   Alkaline Phosphatase 92 38 - 126 U/L   Total Bilirubin 3.2 (H) 0.3 - 1.2 mg/dL   GFR calc non Af Amer >60 >60 mL/min   GFR calc Af Amer >60 >60 mL/min    Anion gap 8 5 - 15  Lipase, blood     Status: Abnormal   Collection Time: 11/19/16  4:41 AM  Result Value Ref Range   Lipase 556 (H) 11 - 51 U/L    Studies/Results: Dg Eye Foreign Body  Result Date: 11/17/2016 CLINICAL DATA:  Metal working/exposure; clearance prior to MRI EXAM: ORBITS FOR FOREIGN BODY - 2 VIEW COMPARISON:  None. FINDINGS: There is no evidence of metallic foreign body within the orbits. No significant bone abnormality identified. IMPRESSION: No evidence of metallic foreign body within the orbits. Electronically Signed   By: Margarette Canada M.D.   On: 11/17/2016 20:14   Mr Abdomen Mrcp Wo Contrast  Result Date: 11/18/2016 CLINICAL DATA:  Gallbladder sludge and pericholecystic fluid on ultrasound, with heterogeneous liver, for further investigation. EXAM: MRI ABDOMEN WITHOUT CONTRAST  (INCLUDING MRCP) TECHNIQUE: Multiplanar multisequence MR imaging of the abdomen was performed. Heavily T2-weighted images of the biliary  and pancreatic ducts were obtained, and three-dimensional MRCP images were rendered by post processing. COMPARISON:  11/17/2016 ultrasound FINDINGS: Despite efforts by the technologist and patient, motion artifact is present on today's exam and could not be eliminated. This reduces exam sensitivity and specificity. Lower chest: Unremarkable Hepatobiliary: Periportal edema but no focal hepatic parenchymal lesion is appreciated. Diffuse gallbladder wall thickening without visible gallstones. Fusiform mild prominence of the common hepatic duct at 8 mm, common bile duct 5 mm, no obvious abnormal filling defect to the limits of resolution of today's exam which is admittedly mildly reduced in sensitivity due to motion artifact. No intrahepatic biliary dilatation. Slightly low position of the attachment of the cystic duct near the pancreatic head. Pancreas:  Peripancreatic fluid/edema. Spleen:  Unremarkable Adrenals/Urinary Tract: Adrenal glands normal. No hydronephrosis or proximal  hydroureter. Mild bilateral perirenal stranding, chronicity uncertain. No focal renal lesion is identified on today' s noncontrast exam. Stomach/Bowel: There is some mild localized wall thickening along the greater curvature of the stomach body, for example on image 21/4, significance uncertain. Edema tracks around the duodenal bulb and descending duodenum. I do not see an obvious ulcer although MRI would be considered less sensitive than endoscopy or upper GI assessment. Vascular/Lymphatic: No specific abnormality is identified on today' s noncontrast assessment. Other: Perihepatic and perisplenic ascites. Peripancreatic edema and a small amount of scattered upper abdominal ascites. Musculoskeletal: Thoracic spondylosis. IMPRESSION: 1. Perihepatic and perisplenic ascites, with peripancreatic edema and edema tracking around the duodenal bulb, and descending and transverse duodenum. Typically I would expect this appearance be due to acute pancreatitis but I do note that the patient's lipase level was normal yesterday. Accordingly alternative possibilities must be considered. Given the patient's pain was acute in onset after eating, other possibilities are acute cholecystitis or perhaps a duodenal ulcer. There certainly gallbladder wall thickening, but no stones or obvious lack of patency of the cystic duct is identified. 2. There is some mild wall thickening along the greater curvature of the stomach body, for example on image 21/4, potentially from gastritis. Sessile tumor is considered less likely. 3. There is some mild fusiform prominence of the common hepatic duct at 8 mm, but the CBD is of normal caliber. Electronically Signed   By: Van Clines M.D.   On: 11/18/2016 08:38   Mr 3d Recon At Scanner  Result Date: 11/18/2016 CLINICAL DATA:  Gallbladder sludge and pericholecystic fluid on ultrasound, with heterogeneous liver, for further investigation. EXAM: MRI ABDOMEN WITHOUT CONTRAST  (INCLUDING MRCP)  TECHNIQUE: Multiplanar multisequence MR imaging of the abdomen was performed. Heavily T2-weighted images of the biliary and pancreatic ducts were obtained, and three-dimensional MRCP images were rendered by post processing. COMPARISON:  11/17/2016 ultrasound FINDINGS: Despite efforts by the technologist and patient, motion artifact is present on today's exam and could not be eliminated. This reduces exam sensitivity and specificity. Lower chest: Unremarkable Hepatobiliary: Periportal edema but no focal hepatic parenchymal lesion is appreciated. Diffuse gallbladder wall thickening without visible gallstones. Fusiform mild prominence of the common hepatic duct at 8 mm, common bile duct 5 mm, no obvious abnormal filling defect to the limits of resolution of today's exam which is admittedly mildly reduced in sensitivity due to motion artifact. No intrahepatic biliary dilatation. Slightly low position of the attachment of the cystic duct near the pancreatic head. Pancreas:  Peripancreatic fluid/edema. Spleen:  Unremarkable Adrenals/Urinary Tract: Adrenal glands normal. No hydronephrosis or proximal hydroureter. Mild bilateral perirenal stranding, chronicity uncertain. No focal renal lesion is identified on today'  s noncontrast exam. Stomach/Bowel: There is some mild localized wall thickening along the greater curvature of the stomach body, for example on image 21/4, significance uncertain. Edema tracks around the duodenal bulb and descending duodenum. I do not see an obvious ulcer although MRI would be considered less sensitive than endoscopy or upper GI assessment. Vascular/Lymphatic: No specific abnormality is identified on today' s noncontrast assessment. Other: Perihepatic and perisplenic ascites. Peripancreatic edema and a small amount of scattered upper abdominal ascites. Musculoskeletal: Thoracic spondylosis. IMPRESSION: 1. Perihepatic and perisplenic ascites, with peripancreatic edema and edema tracking around  the duodenal bulb, and descending and transverse duodenum. Typically I would expect this appearance be due to acute pancreatitis but I do note that the patient's lipase level was normal yesterday. Accordingly alternative possibilities must be considered. Given the patient's pain was acute in onset after eating, other possibilities are acute cholecystitis or perhaps a duodenal ulcer. There certainly gallbladder wall thickening, but no stones or obvious lack of patency of the cystic duct is identified. 2. There is some mild wall thickening along the greater curvature of the stomach body, for example on image 21/4, potentially from gastritis. Sessile tumor is considered less likely. 3. There is some mild fusiform prominence of the common hepatic duct at 8 mm, but the CBD is of normal caliber. Electronically Signed   By: Van Clines M.D.   On: 11/18/2016 08:38   US Abdomen Limited  Result Date: 11/17/2016 CLINICAL DATA:  Right upper quadrant pain. EXAM: ULTRASOUND ABDOMEN LIMITED RIGHT UPPER QUADRANT COMPARISON:  None. FINDINGS: Gallbladder: The gallbladder is normally distended. There is layering gallbladder sludge. The gallbladder wall thickness measures 2 mm, which is normal. However there is a suggestion of tiny amount of pericholecystic fluid. This sonographic Murphy's sign was recorded is negative. Common bile duct: Diameter: 6 mm Liver: No focal lesion identified. Diffusely heterogeneous echotexture of the liver. IMPRESSION: Gallbladder sludge without evidence of abnormal distention or gallbladder wall thickening. There is however a suggestion of tiny amount of pericholecystic fluid, which may indicate early inflammatory changes. Sonographic Murphy's sign was recorded is negative. Diffusely heterogeneous echotexture of the liver, usually associated with hepatic fibrosis. Electronically Signed   By: Fidela Salisbury M.D.   On: 11/17/2016 10:03      Assessment: 1. Gallstone pancreatitis, lipase  now elevated after initially normal. Liver function tests following. WBC count up to 24,000  Plan: Continue IV antibiotics, PPI and bowel rest. Hopefully cholecystectomy soon depending on overall improvement of pancreatitis We'll continue to follow    Tracy Smith 11/19/2016, 9:53 AM  Pager [phone] If no answer or after 5 PM call [phone]

## 2016-11-19 NOTE — Progress Notes (Signed)
Assessment Acute gallstone pancreatitis-pain about the same; WBC up some lipase 824->556; TG level normal  Plan:  Continue bowel rest, hydration, PPI, empiric abxs.  Cholecystectomy this admission if he gets better soon.   LOS: 2 days        Chief Complaint/Subjective: Pain now is mostly in RUQ vs epigastrium.  Objective: Vital signs in last 24 hours: Temp:  [99.1 F (37.3 C)-100.4 F (38 C)] 99.4 F (37.4 C) (07/08 0449) Pulse Rate:  [102-105] 102 (07/08 0449) Resp:  [18-19] 18 (07/08 0449) BP: (122-127)/(68-80) 122/68 (07/08 0449) SpO2:  [96 %-97 %] 96 % (07/08 0449) Last BM Date: 11/17/16  Intake/Output from previous day: 07/07 0701 - 07/08 0700 In: 1890 [I.V.:1840; IV Piggyback:50] Out: 975 [Urine:975] Intake/Output this shift: No intake/output data recorded.  PE: General- In NAD.  Awake and alert. Jaundice. Abdomen-soft, tenderness and guarding in RUQ and epigastrium but more so in RUQ  Lab Results:   Recent Labs  11/18/16 0433 11/19/16 0441  WBC 20.0* 24.3*  HGB 16.5 15.1  HCT 46.9 43.3  PLT 192 155   BMET  Recent Labs  11/18/16 0433 11/19/16 0441  NA 137 135  K 4.7 3.5  CL 105 105  CO2 24 22  GLUCOSE 187* 117*  BUN 19 16  CREATININE 1.37* 1.10  CALCIUM 8.6* 7.9*   PT/INR No results for input(s): LABPROT, INR in the last 72 hours. Comprehensive Metabolic Panel:    Component Value Date/Time   NA 135 11/19/2016 0441   NA 137 11/18/2016 0433   K 3.5 11/19/2016 0441   K 4.7 11/18/2016 0433   CL 105 11/19/2016 0441   CL 105 11/18/2016 0433   CO2 22 11/19/2016 0441   CO2 24 11/18/2016 0433   BUN 16 11/19/2016 0441   BUN 19 11/18/2016 0433   CREATININE 1.10 11/19/2016 0441   CREATININE 1.37 (H) 11/18/2016 0433   GLUCOSE 117 (H) 11/19/2016 0441   GLUCOSE 187 (H) 11/18/2016 0433   CALCIUM 7.9 (L) 11/19/2016 0441   CALCIUM 8.6 (L) 11/18/2016 0433   AST 76 (H) 11/19/2016 0441   AST 223 (H) 11/18/2016 0433   ALT 208 (H) 11/19/2016 0441    ALT 344 (H) 11/18/2016 0433   ALKPHOS 92 11/19/2016 0441   ALKPHOS 105 11/18/2016 0433   BILITOT 3.2 (H) 11/19/2016 0441   BILITOT 5.1 (H) 11/18/2016 0433   PROT 6.3 (L) 11/19/2016 0441   PROT 6.1 (L) 11/18/2016 0433   ALBUMIN 3.0 (L) 11/19/2016 0441   ALBUMIN 3.7 11/18/2016 0433     Studies/Results: Dg Eye Foreign Body  Result Date: 11/17/2016 CLINICAL DATA:  Metal working/exposure; clearance prior to MRI EXAM: ORBITS FOR FOREIGN BODY - 2 VIEW COMPARISON:  None. FINDINGS: There is no evidence of metallic foreign body within the orbits. No significant bone abnormality identified. IMPRESSION: No evidence of metallic foreign body within the orbits. Electronically Signed   By: Margarette Canada M.D.   On: 11/17/2016 20:14   Mr Abdomen Mrcp Wo Contrast  Result Date: 11/18/2016 CLINICAL DATA:  Gallbladder sludge and pericholecystic fluid on ultrasound, with heterogeneous liver, for further investigation. EXAM: MRI ABDOMEN WITHOUT CONTRAST  (INCLUDING MRCP) TECHNIQUE: Multiplanar multisequence MR imaging of the abdomen was performed. Heavily T2-weighted images of the biliary and pancreatic ducts were obtained, and three-dimensional MRCP images were rendered by post processing. COMPARISON:  11/17/2016 ultrasound FINDINGS: Despite efforts by the technologist and patient, motion artifact is present on today's exam and could not be eliminated. This reduces  exam sensitivity and specificity. Lower chest: Unremarkable Hepatobiliary: Periportal edema but no focal hepatic parenchymal lesion is appreciated. Diffuse gallbladder wall thickening without visible gallstones. Fusiform mild prominence of the common hepatic duct at 8 mm, common bile duct 5 mm, no obvious abnormal filling defect to the limits of resolution of today's exam which is admittedly mildly reduced in sensitivity due to motion artifact. No intrahepatic biliary dilatation. Slightly low position of the attachment of the cystic duct near the pancreatic head.  Pancreas:  Peripancreatic fluid/edema. Spleen:  Unremarkable Adrenals/Urinary Tract: Adrenal glands normal. No hydronephrosis or proximal hydroureter. Mild bilateral perirenal stranding, chronicity uncertain. No focal renal lesion is identified on today' s noncontrast exam. Stomach/Bowel: There is some mild localized wall thickening along the greater curvature of the stomach body, for example on image 21/4, significance uncertain. Edema tracks around the duodenal bulb and descending duodenum. I do not see an obvious ulcer although MRI would be considered less sensitive than endoscopy or upper GI assessment. Vascular/Lymphatic: No specific abnormality is identified on today' s noncontrast assessment. Other: Perihepatic and perisplenic ascites. Peripancreatic edema and a small amount of scattered upper abdominal ascites. Musculoskeletal: Thoracic spondylosis. IMPRESSION: 1. Perihepatic and perisplenic ascites, with peripancreatic edema and edema tracking around the duodenal bulb, and descending and transverse duodenum. Typically I would expect this appearance be due to acute pancreatitis but I do note that the patient's lipase level was normal yesterday. Accordingly alternative possibilities must be considered. Given the patient's pain was acute in onset after eating, other possibilities are acute cholecystitis or perhaps a duodenal ulcer. There certainly gallbladder wall thickening, but no stones or obvious lack of patency of the cystic duct is identified. 2. There is some mild wall thickening along the greater curvature of the stomach body, for example on image 21/4, potentially from gastritis. Sessile tumor is considered less likely. 3. There is some mild fusiform prominence of the common hepatic duct at 8 mm, but the CBD is of normal caliber. Electronically Signed   By: Van Clines M.D.   On: 11/18/2016 08:38   Mr 3d Recon At Scanner  Result Date: 11/18/2016 CLINICAL DATA:  Gallbladder sludge and  pericholecystic fluid on ultrasound, with heterogeneous liver, for further investigation. EXAM: MRI ABDOMEN WITHOUT CONTRAST  (INCLUDING MRCP) TECHNIQUE: Multiplanar multisequence MR imaging of the abdomen was performed. Heavily T2-weighted images of the biliary and pancreatic ducts were obtained, and three-dimensional MRCP images were rendered by post processing. COMPARISON:  11/17/2016 ultrasound FINDINGS: Despite efforts by the technologist and patient, motion artifact is present on today's exam and could not be eliminated. This reduces exam sensitivity and specificity. Lower chest: Unremarkable Hepatobiliary: Periportal edema but no focal hepatic parenchymal lesion is appreciated. Diffuse gallbladder wall thickening without visible gallstones. Fusiform mild prominence of the common hepatic duct at 8 mm, common bile duct 5 mm, no obvious abnormal filling defect to the limits of resolution of today's exam which is admittedly mildly reduced in sensitivity due to motion artifact. No intrahepatic biliary dilatation. Slightly low position of the attachment of the cystic duct near the pancreatic head. Pancreas:  Peripancreatic fluid/edema. Spleen:  Unremarkable Adrenals/Urinary Tract: Adrenal glands normal. No hydronephrosis or proximal hydroureter. Mild bilateral perirenal stranding, chronicity uncertain. No focal renal lesion is identified on today' s noncontrast exam. Stomach/Bowel: There is some mild localized wall thickening along the greater curvature of the stomach body, for example on image 21/4, significance uncertain. Edema tracks around the duodenal bulb and descending duodenum. I do not see an  obvious ulcer although MRI would be considered less sensitive than endoscopy or upper GI assessment. Vascular/Lymphatic: No specific abnormality is identified on today' s noncontrast assessment. Other: Perihepatic and perisplenic ascites. Peripancreatic edema and a small amount of scattered upper abdominal ascites.  Musculoskeletal: Thoracic spondylosis. IMPRESSION: 1. Perihepatic and perisplenic ascites, with peripancreatic edema and edema tracking around the duodenal bulb, and descending and transverse duodenum. Typically I would expect this appearance be due to acute pancreatitis but I do note that the patient's lipase level was normal yesterday. Accordingly alternative possibilities must be considered. Given the patient's pain was acute in onset after eating, other possibilities are acute cholecystitis or perhaps a duodenal ulcer. There certainly gallbladder wall thickening, but no stones or obvious lack of patency of the cystic duct is identified. 2. There is some mild wall thickening along the greater curvature of the stomach body, for example on image 21/4, potentially from gastritis. Sessile tumor is considered less likely. 3. There is some mild fusiform prominence of the common hepatic duct at 8 mm, but the CBD is of normal caliber. Electronically Signed   By: Van Clines M.D.   On: 11/18/2016 08:38   US Abdomen Limited  Result Date: 11/17/2016 CLINICAL DATA:  Right upper quadrant pain. EXAM: ULTRASOUND ABDOMEN LIMITED RIGHT UPPER QUADRANT COMPARISON:  None. FINDINGS: Gallbladder: The gallbladder is normally distended. There is layering gallbladder sludge. The gallbladder wall thickness measures 2 mm, which is normal. However there is a suggestion of tiny amount of pericholecystic fluid. This sonographic Murphy's sign was recorded is negative. Common bile duct: Diameter: 6 mm Liver: No focal lesion identified. Diffusely heterogeneous echotexture of the liver. IMPRESSION: Gallbladder sludge without evidence of abnormal distention or gallbladder wall thickening. There is however a suggestion of tiny amount of pericholecystic fluid, which may indicate early inflammatory changes. Sonographic Murphy's sign was recorded is negative. Diffusely heterogeneous echotexture of the liver, usually associated with hepatic  fibrosis. Electronically Signed   By: Fidela Salisbury M.D.   On: 11/17/2016 10:03    Anti-infectives: Anti-infectives    Start     Dose/Rate Route Frequency Ordered Stop   11/18/16 1000  piperacillin-tazobactam (ZOSYN) IVPB 3.375 g     3.375 g 12.5 mL/hr over 240 Minutes Intravenous Every 8 hours 11/18/16 0923         Amma Crear,Dalin J 11/19/2016

## 2016-11-20 ENCOUNTER — Inpatient Hospital Stay (HOSPITAL_COMMUNITY): Payer: BLUE CROSS/BLUE SHIELD

## 2016-11-20 HISTORY — PX: CHOLECYSTECTOMY: SHX55

## 2016-11-20 LAB — CBC
HCT: 43.1 % (ref 39.0–52.0)
Hemoglobin: 15 g/dL (ref 13.0–17.0)
MCH: 31.8 pg (ref 26.0–34.0)
MCHC: 34.8 g/dL (ref 30.0–36.0)
MCV: 91.5 fL (ref 78.0–100.0)
PLATELETS: 132 10*3/uL — AB (ref 150–400)
RBC: 4.71 MIL/uL (ref 4.22–5.81)
RDW: 13.2 % (ref 11.5–15.5)
WBC: 20.7 10*3/uL — ABNORMAL HIGH (ref 4.0–10.5)

## 2016-11-20 LAB — COMPREHENSIVE METABOLIC PANEL
ALBUMIN: 3.4 g/dL — AB (ref 3.5–5.0)
ALT: 117 U/L — ABNORMAL HIGH (ref 17–63)
ANION GAP: 11 (ref 5–15)
AST: 37 U/L (ref 15–41)
Alkaline Phosphatase: 75 U/L (ref 38–126)
BUN: 14 mg/dL (ref 6–20)
CALCIUM: 8.2 mg/dL — AB (ref 8.9–10.3)
CHLORIDE: 103 mmol/L (ref 101–111)
CO2: 22 mmol/L (ref 22–32)
Creatinine, Ser: 1.09 mg/dL (ref 0.61–1.24)
GFR calc non Af Amer: >60 mL/min (ref >60)
GLUCOSE: 109 mg/dL — AB (ref 65–99)
POTASSIUM: 3.5 mmol/L (ref 3.5–5.1)
SODIUM: 136 mmol/L (ref 135–145)
Total Bilirubin: 2.3 mg/dL — ABNORMAL HIGH (ref 0.3–1.2)
Total Protein: 6.6 g/dL (ref 6.5–8.1)

## 2016-11-20 LAB — LIPASE, BLOOD: Lipase: 87 U/L — ABNORMAL HIGH (ref 11–51)

## 2016-11-20 MED ORDER — BISACODYL 10 MG RE SUPP
10.0000 mg | Freq: Once | RECTAL | Status: AC
  Administered 2016-11-20: 10 mg via RECTAL
  Filled 2016-11-20: qty 1

## 2016-11-20 NOTE — Progress Notes (Signed)
[facility] Progress Note     Subjective: CC: abdominal pain and distention Patient had just finished showering and ambulating back to bed. Complaining of increased distention, states he is not passing gas and last BM was 7/6. Denies nausea or vomiting. Pain is mostly in the RUQ and worsened by pressure or certain movements. Patient also reports he received some pills this AM, per [facility] he was given tylenol 650 mg. No documented fever in the last 24 hr. UOP good.   Objective: Vital signs in last 24 hours: Temp:  [98.6 F (37 C)-99.1 F (37.3 C)] 98.7 F (37.1 C) (07/09 0555) Pulse Rate:  [87-104] 87 (07/09 0555) Resp:  [16-19] 19 (07/09 0555) BP: (135-148)/(73-87) 148/87 (07/09 0555) SpO2:  [94 %-95 %] 94 % (07/09 0555) Last BM Date: 11/17/16  Intake/Output from previous day: 07/08 0701 - 07/09 0700 In: 0  Out: 750 [Urine:750] Intake/Output this shift: No intake/output data recorded.  PE: Gen:  Alert, NAD, pleasant Card:  Regular rate and rhythm, no M/G/R appreciated Pulm:  Slightly tachypneic , clear to auscultation bilaterally Abd: Soft, TTP in the RUQ, distended, bowel sounds present   Skin: warm and dry, no rashes  Psych: A&Ox3   Lab Results:   Recent Labs  11/18/16 0433 11/19/16 0441  WBC 20.0* 24.3*  HGB 16.5 15.1  HCT 46.9 43.3  PLT 192 155   BMET  Recent Labs  11/18/16 0433 11/19/16 0441  NA 137 135  K 4.7 3.5  CL 105 105  CO2 24 22  GLUCOSE 187* 117*  BUN 19 16  CREATININE 1.37* 1.10  CALCIUM 8.6* 7.9*   CMP     Component Value Date/Time   NA 135 11/19/2016 0441   K 3.5 11/19/2016 0441   CL 105 11/19/2016 0441   CO2 22 11/19/2016 0441   GLUCOSE 117 (H) 11/19/2016 0441   BUN 16 11/19/2016 0441   CREATININE 1.10 11/19/2016 0441   CALCIUM 7.9 (L) 11/19/2016 0441   PROT 6.3 (L) 11/19/2016 0441   ALBUMIN 3.0 (L) 11/19/2016 0441   AST 76 (H) 11/19/2016 0441   ALT 208 (H) 11/19/2016 0441   ALKPHOS 92 11/19/2016 0441   BILITOT  3.2 (H) 11/19/2016 0441   GFRNONAA >60 11/19/2016 0441   GFRAA >60 11/19/2016 0441   Lipase     Component Value Date/Time   LIPASE 556 (H) 11/19/2016 0441    Anti-infectives: Anti-infectives    Start     Dose/Rate Route Frequency Ordered Stop   11/18/16 1000  piperacillin-tazobactam (ZOSYN) IVPB 3.375 g     3.375 g 12.5 mL/hr over 240 Minutes Intravenous Every 8 hours 11/18/16 5009         Assessment/Plan Acute gallstone pancreatitis Acute cholecystitis - lipase yesterday 556, recheck today - WBC up to 24 yesterday, afebrile - recheck cbc today - plan for lap chole when pancreatitis improved Hyperbilirubinemia - Tbili 3.2 yesterday - AST 76 and ALT 208 yesterday - recheck CMET Abdominal distention - bowel sounds present - no flatus, last BM 7/6 - AXR ordered   FEN - NPO/IVF. Pain control, prn antiemetics VTE - SCDs ID - IV zosyn (7/7>>)  Plan: Recheck labs, check AXR. NPO/IVF. Continue IV abx. Plan for cholecystectomy when pancreatitis improved.   LOS: 3 days    Brigid Re , [facility] 11/20/2016, 8:59 AM Pager: [phone] Consults: [phone] Mon-Fri 7:00 am-4:30 pm Sat-Sun 7:00 am-11:30 am

## 2016-11-21 ENCOUNTER — Inpatient Hospital Stay (HOSPITAL_COMMUNITY): Payer: BLUE CROSS/BLUE SHIELD | Admitting: Anesthesiology

## 2016-11-21 ENCOUNTER — Encounter (HOSPITAL_COMMUNITY): Payer: Self-pay | Admitting: Certified Registered Nurse Anesthetist

## 2016-11-21 ENCOUNTER — Encounter (HOSPITAL_COMMUNITY): Admission: EM | Disposition: A | Discharge status: Home / Self Care | Payer: Self-pay | Source: Home / Self Care

## 2016-11-21 HISTORY — PX: CHOLECYSTECTOMY: SHX55

## 2016-11-21 LAB — COMPREHENSIVE METABOLIC PANEL
ALT: 74 U/L — AB (ref 17–63)
AST: 26 U/L (ref 15–41)
Albumin: 2.8 g/dL — ABNORMAL LOW (ref 3.5–5.0)
Alkaline Phosphatase: 66 U/L (ref 38–126)
Anion gap: 10 (ref 5–15)
BUN: 13 mg/dL (ref 6–20)
CHLORIDE: 105 mmol/L (ref 101–111)
CO2: 22 mmol/L (ref 22–32)
CREATININE: 0.97 mg/dL (ref 0.61–1.24)
Calcium: 7.8 mg/dL — ABNORMAL LOW (ref 8.9–10.3)
GFR calc non Af Amer: >60 mL/min (ref >60)
Glucose, Bld: 99 mg/dL (ref 65–99)
POTASSIUM: 3.3 mmol/L — AB (ref 3.5–5.1)
SODIUM: 137 mmol/L (ref 135–145)
Total Bilirubin: 1.9 mg/dL — ABNORMAL HIGH (ref 0.3–1.2)
Total Protein: 6.1 g/dL — ABNORMAL LOW (ref 6.5–8.1)

## 2016-11-21 LAB — CBC
HCT: 41.3 % (ref 39.0–52.0)
Hemoglobin: 14.4 g/dL (ref 13.0–17.0)
MCH: 31.6 pg (ref 26.0–34.0)
MCHC: 34.9 g/dL (ref 30.0–36.0)
MCV: 90.6 fL (ref 78.0–100.0)
PLATELETS: 143 10*3/uL — AB (ref 150–400)
RBC: 4.56 MIL/uL (ref 4.22–5.81)
RDW: 13.2 % (ref 11.5–15.5)
WBC: 19.4 10*3/uL — AB (ref 4.0–10.5)

## 2016-11-21 LAB — SURGICAL PCR SCREEN
MRSA, PCR: NEGATIVE
STAPHYLOCOCCUS AUREUS: NEGATIVE

## 2016-11-21 SURGERY — LAPAROSCOPIC CHOLECYSTECTOMY
Anesthesia: General | Site: Abdomen

## 2016-11-21 MED ORDER — SUGAMMADEX SODIUM 200 MG/2ML IV SOLN
INTRAVENOUS | Status: AC
  Filled 2016-11-21: qty 2

## 2016-11-21 MED ORDER — 0.9 % SODIUM CHLORIDE (POUR BTL) OPTIME
TOPICAL | Status: DC | PRN
  Administered 2016-11-21: 1000 mL

## 2016-11-21 MED ORDER — OXYCODONE HCL 5 MG PO TABS
ORAL_TABLET | ORAL | Status: AC
  Administered 2016-11-21: 10 mg via ORAL
  Filled 2016-11-21: qty 2

## 2016-11-21 MED ORDER — SUGAMMADEX SODIUM 200 MG/2ML IV SOLN
INTRAVENOUS | Status: DC | PRN
  Administered 2016-11-21: 200 mg via INTRAVENOUS

## 2016-11-21 MED ORDER — LIDOCAINE 2% (20 MG/ML) 5 ML SYRINGE
INTRAMUSCULAR | Status: DC | PRN
  Administered 2016-11-21: 60 mg via INTRAVENOUS

## 2016-11-21 MED ORDER — BUPIVACAINE-EPINEPHRINE 0.25% -1:200000 IJ SOLN
INTRAMUSCULAR | Status: DC | PRN
  Administered 2016-11-21: 15 mL

## 2016-11-21 MED ORDER — MIDAZOLAM HCL 5 MG/5ML IJ SOLN
INTRAMUSCULAR | Status: DC | PRN
  Administered 2016-11-21: 2 mg via INTRAVENOUS

## 2016-11-21 MED ORDER — ROCURONIUM BROMIDE 50 MG/5ML IV SOLN
INTRAVENOUS | Status: AC
  Filled 2016-11-21: qty 1

## 2016-11-21 MED ORDER — FENTANYL CITRATE (PF) 100 MCG/2ML IJ SOLN
INTRAMUSCULAR | Status: AC
Start: 2016-11-21 — End: 2016-11-22
  Filled 2016-11-21: qty 2

## 2016-11-21 MED ORDER — DEXAMETHASONE SODIUM PHOSPHATE 10 MG/ML IJ SOLN
INTRAMUSCULAR | Status: DC | PRN
  Administered 2016-11-21: 10 mg via INTRAVENOUS

## 2016-11-21 MED ORDER — OXYCODONE HCL 5 MG PO TABS
5.0000 mg | ORAL_TABLET | ORAL | Status: DC | PRN
  Administered 2016-11-21: 10 mg via ORAL

## 2016-11-21 MED ORDER — ALBUTEROL SULFATE (2.5 MG/3ML) 0.083% IN NEBU
INHALATION_SOLUTION | RESPIRATORY_TRACT | Status: AC
  Administered 2016-11-21: 2.5 mg
  Filled 2016-11-21: qty 3

## 2016-11-21 MED ORDER — SODIUM CHLORIDE 0.9 % IR SOLN
Status: DC | PRN
  Administered 2016-11-21: 1000 mL

## 2016-11-21 MED ORDER — ONDANSETRON HCL 4 MG/2ML IJ SOLN: 4.0000 mg | Freq: Once | INTRAMUSCULAR | Status: DC | PRN

## 2016-11-21 MED ORDER — IPRATROPIUM-ALBUTEROL 0.5-2.5 (3) MG/3ML IN SOLN: 3.0000 mL | Freq: Once | RESPIRATORY_TRACT | Status: DC

## 2016-11-21 MED ORDER — ROCURONIUM BROMIDE 10 MG/ML (PF) SYRINGE
PREFILLED_SYRINGE | INTRAVENOUS | Status: DC | PRN
  Administered 2016-11-21: 40 mg via INTRAVENOUS
  Administered 2016-11-21: 10 mg via INTRAVENOUS

## 2016-11-21 MED ORDER — ONDANSETRON HCL 4 MG/2ML IJ SOLN
INTRAMUSCULAR | Status: DC | PRN
  Administered 2016-11-21: 4 mg via INTRAVENOUS

## 2016-11-21 MED ORDER — PHENYLEPHRINE HCL 10 MG/ML IJ SOLN
INTRAVENOUS | Status: DC | PRN
  Administered 2016-11-21: 10 ug/min via INTRAVENOUS

## 2016-11-21 MED ORDER — MEPERIDINE HCL 25 MG/ML IJ SOLN: 6.2500 mg | INTRAMUSCULAR | Status: DC | PRN

## 2016-11-21 MED ORDER — LACTATED RINGERS IV SOLN
INTRAVENOUS | Status: DC
  Administered 2016-11-21: 10:00:00 via INTRAVENOUS

## 2016-11-21 MED ORDER — FENTANYL CITRATE (PF) 100 MCG/2ML IJ SOLN
INTRAMUSCULAR | Status: AC
  Administered 2016-11-21: 50 ug via INTRAVENOUS
  Filled 2016-11-21: qty 2

## 2016-11-21 MED ORDER — FENTANYL CITRATE (PF) 100 MCG/2ML IJ SOLN
25.0000 ug | INTRAMUSCULAR | Status: DC | PRN
  Administered 2016-11-21 (×2): 50 ug via INTRAVENOUS

## 2016-11-21 MED ORDER — PROPOFOL 10 MG/ML IV BOLUS
INTRAVENOUS | Status: AC
  Filled 2016-11-21: qty 40

## 2016-11-21 MED ORDER — MIDAZOLAM HCL 2 MG/2ML IJ SOLN
INTRAMUSCULAR | Status: AC
  Filled 2016-11-21: qty 2

## 2016-11-21 MED ORDER — ACETAMINOPHEN 325 MG PO TABS
ORAL_TABLET | ORAL | Status: AC
  Administered 2016-11-21: 650 mg via ORAL
  Filled 2016-11-21: qty 2

## 2016-11-21 MED ORDER — PROPOFOL 10 MG/ML IV BOLUS
INTRAVENOUS | Status: DC | PRN
  Administered 2016-11-21: 200 mg via INTRAVENOUS

## 2016-11-21 MED ORDER — METHOCARBAMOL 1000 MG/10ML IJ SOLN
1000.0000 mg | Freq: Three times a day (TID) | INTRAVENOUS | Status: DC | PRN
  Administered 2016-11-21: 1000 mg via INTRAVENOUS
  Filled 2016-11-21 (×2): qty 10

## 2016-11-21 MED ORDER — LACTATED RINGERS IV SOLN
INTRAVENOUS | Status: DC | PRN
  Administered 2016-11-21: 10:00:00 via INTRAVENOUS

## 2016-11-21 MED ORDER — PHENYLEPHRINE 40 MCG/ML (10ML) SYRINGE FOR IV PUSH (FOR BLOOD PRESSURE SUPPORT)
PREFILLED_SYRINGE | INTRAVENOUS | Status: DC | PRN
  Administered 2016-11-21: 80 ug via INTRAVENOUS
  Administered 2016-11-21: 40 ug via INTRAVENOUS
  Administered 2016-11-21 (×2): 80 ug via INTRAVENOUS
  Administered 2016-11-21: 120 ug via INTRAVENOUS

## 2016-11-21 MED ORDER — IOPAMIDOL (ISOVUE-300) INJECTION 61%
INTRAVENOUS | Status: AC
  Filled 2016-11-21: qty 50

## 2016-11-21 MED ORDER — DEXAMETHASONE SODIUM PHOSPHATE 10 MG/ML IJ SOLN
INTRAMUSCULAR | Status: AC
  Filled 2016-11-21: qty 1

## 2016-11-21 MED ORDER — BUPIVACAINE-EPINEPHRINE (PF) 0.25% -1:200000 IJ SOLN
INTRAMUSCULAR | Status: AC
  Filled 2016-11-21: qty 30

## 2016-11-21 MED ORDER — FENTANYL CITRATE (PF) 100 MCG/2ML IJ SOLN
INTRAMUSCULAR | Status: DC | PRN
  Administered 2016-11-21: 100 ug via INTRAVENOUS
  Administered 2016-11-21: 50 ug via INTRAVENOUS

## 2016-11-21 MED ORDER — LIDOCAINE HCL (CARDIAC) 20 MG/ML IV SOLN
INTRAVENOUS | Status: AC
  Filled 2016-11-21: qty 5

## 2016-11-21 MED ORDER — FENTANYL CITRATE (PF) 250 MCG/5ML IJ SOLN
INTRAMUSCULAR | Status: AC
  Filled 2016-11-21: qty 5

## 2016-11-21 MED ORDER — SODIUM CHLORIDE 0.9 % IV SOLN
INTRAVENOUS | Status: DC | PRN
  Administered 2016-11-21: .1 mL

## 2016-11-21 MED ORDER — PHENYLEPHRINE 40 MCG/ML (10ML) SYRINGE FOR IV PUSH (FOR BLOOD PRESSURE SUPPORT)
PREFILLED_SYRINGE | INTRAVENOUS | Status: AC
Start: 2016-11-21 — End: ?
  Filled 2016-11-21: qty 10

## 2016-11-21 SURGICAL SUPPLY — 42 items
APPLIER CLIP 5 13 M/L LIGAMAX5 (MISCELLANEOUS) ×4
BLADE CLIPPER SURG (BLADE) IMPLANT
CANISTER SUCT 3000ML PPV (MISCELLANEOUS) ×4 IMPLANT
CHLORAPREP W/TINT 26ML (MISCELLANEOUS) ×4 IMPLANT
CLIP APPLIE 5 13 M/L LIGAMAX5 (MISCELLANEOUS) ×2 IMPLANT
COVER MAYO STAND STRL (DRAPES) ×4 IMPLANT
COVER SURGICAL LIGHT HANDLE (MISCELLANEOUS) ×4 IMPLANT
DERMABOND ADVANCED (GAUZE/BANDAGES/DRESSINGS) ×2
DERMABOND ADVANCED .7 DNX12 (GAUZE/BANDAGES/DRESSINGS) ×2 IMPLANT
DRAPE C-ARM 42X72 X-RAY (DRAPES) ×4 IMPLANT
ELECT REM PT RETURN 9FT ADLT (ELECTROSURGICAL) ×4
ELECTRODE REM PT RTRN 9FT ADLT (ELECTROSURGICAL) ×2 IMPLANT
FILTER SMOKE EVAC LAPAROSHD (FILTER) IMPLANT
GLOVE BIO SURGEON STRL SZ8 (GLOVE) ×4 IMPLANT
GLOVE BIOGEL PI IND STRL 8 (GLOVE) ×2 IMPLANT
GLOVE BIOGEL PI INDICATOR 8 (GLOVE) ×2
GOWN STRL REUS W/ TWL LRG LVL3 (GOWN DISPOSABLE) ×4 IMPLANT
GOWN STRL REUS W/ TWL XL LVL3 (GOWN DISPOSABLE) ×2 IMPLANT
GOWN STRL REUS W/TWL LRG LVL3 (GOWN DISPOSABLE) ×4
GOWN STRL REUS W/TWL XL LVL3 (GOWN DISPOSABLE) ×2
KIT BASIN OR (CUSTOM PROCEDURE TRAY) ×4 IMPLANT
KIT ROOM TURNOVER OR (KITS) ×4 IMPLANT
L-HOOK LAP DISP 36CM (ELECTROSURGICAL) ×4
LHOOK LAP DISP 36CM (ELECTROSURGICAL) ×2 IMPLANT
NEEDLE 22X1 1/2 (OR ONLY) (NEEDLE) ×4 IMPLANT
NS IRRIG 1000ML POUR BTL (IV SOLUTION) ×4 IMPLANT
PAD ARMBOARD 7.5X6 YLW CONV (MISCELLANEOUS) ×4 IMPLANT
PENCIL BUTTON HOLSTER BLD 10FT (ELECTRODE) ×4 IMPLANT
POUCH RETRIEVAL ECOSAC 10 (ENDOMECHANICALS) ×2 IMPLANT
POUCH RETRIEVAL ECOSAC 10MM (ENDOMECHANICALS) ×2
SCISSORS LAP 5X35 DISP (ENDOMECHANICALS) ×4 IMPLANT
SET CHOLANGIOGRAPH 5 50 .035 (SET/KITS/TRAYS/PACK) ×4 IMPLANT
SET IRRIG TUBING LAPAROSCOPIC (IRRIGATION / IRRIGATOR) ×4 IMPLANT
SLEEVE ENDOPATH XCEL 5M (ENDOMECHANICALS) ×8 IMPLANT
SPECIMEN JAR SMALL (MISCELLANEOUS) ×4 IMPLANT
SUT VIC AB 4-0 PS2 27 (SUTURE) ×4 IMPLANT
TOWEL OR 17X24 6PK STRL BLUE (TOWEL DISPOSABLE) ×4 IMPLANT
TOWEL OR 17X26 10 PK STRL BLUE (TOWEL DISPOSABLE) ×4 IMPLANT
TRAY LAPAROSCOPIC MC (CUSTOM PROCEDURE TRAY) ×4 IMPLANT
TROCAR XCEL BLUNT TIP 100MML (ENDOMECHANICALS) ×4 IMPLANT
TROCAR XCEL NON-BLD 5MMX100MML (ENDOMECHANICALS) ×4 IMPLANT
TUBING INSUFFLATION (TUBING) ×4 IMPLANT

## 2016-11-21 NOTE — Progress Notes (Addendum)
Pt c/o hard to breathe, abd is distended, hard and tight. Pt denies nausea, no vomiting. Bowel sounds active. Denies pain, "just uncomfortable". Dr Grandville Silos notified. 1730 Pt feeling better after walking to the hall, + flatus.

## 2016-11-21 NOTE — Progress Notes (Signed)
   Subjective/Chief Complaint: CC passing more gas, still some central abd pain   Objective: Vital signs in last 24 hours: Temp:  [98.5 F (36.9 C)-98.8 F (37.1 C)] 98.8 F (37.1 C) (07/10 0521) Pulse Rate:  [93-107] 107 (07/10 0521) Resp:  [18] 18 (07/10 0521) BP: (140-161)/(72-91) 161/87 (07/10 0521) SpO2:  [94 %-96 %] 96 % (07/10 0521) Last BM Date: 11/17/16  Intake/Output from previous day: 07/09 0701 - 07/10 0700 In: 783 [I.V.:733; IV Piggyback:50] Out: 350 [Urine:350] Intake/Output this shift: No intake/output data recorded.  General appearance: alert and cooperative Resp: clear to auscultation bilaterally Cardio: regular rate and rhythm GI: soft, distended, minimal epig tenderness  Lab Results:   Recent Labs  11/20/16 1042 11/21/16 0536  WBC 20.7* 19.4*  HGB 15.0 14.4  HCT 43.1 41.3  PLT 132* 143*   BMET  Recent Labs  11/20/16 1042 11/21/16 0536  NA 136 137  K 3.5 3.3*  CL 103 105  CO2 22 22  GLUCOSE 109* 99  BUN 14 13  CREATININE 1.09 0.97  CALCIUM 8.2* 7.8*   PT/INR No results for input(s): LABPROT, INR in the last 72 hours. ABG No results for input(s): PHART, HCO3 in the last 72 hours.  Invalid input(s): PCO2, PO2  Studies/Results: Dg Abd Portable 1v  Result Date: 11/20/2016 CLINICAL DATA:  Abdominal distention. EXAM: PORTABLE ABDOMEN - 1 VIEW COMPARISON:  MRI dated 11/17/2016 FINDINGS: There is moderate air in the nondistended small bowel. The colon is not distended. No evidence of fecal impaction or excessive stool in the colon. No acute bone abnormality. IMPRESSION: Benign appearing abdomen. No excessive stool in the bowel. No abnormal bowel dilatation. Electronically Signed   By: Lorriane Shire M.D.   On: 11/20/2016 09:49    Anti-infectives: Anti-infectives    Start     Dose/Rate Route Frequency Ordered Stop   11/18/16 1000  piperacillin-tazobactam (ZOSYN) IVPB 3.375 g     3.375 g 12.5 mL/hr over 240 Minutes Intravenous Every 8  hours 11/18/16 5397        Assessment/Plan: Acute gallstone pancreatitis Acute cholecystitis - lipase and LFTs improved - plan for lap chole/IOC today. Procedure, risks, and benefits discussed. He agrees.  LOS: 4 days    Tylia Ewell E 11/21/2016

## 2016-11-21 NOTE — Anesthesia Preprocedure Evaluation (Signed)
Anesthesia Evaluation  Patient identified by MRN, date of birth, ID band Patient awake    Reviewed: Allergy & Precautions, NPO status , Patient's Chart, lab work & pertinent test results  Airway Mallampati: II  TM Distance: >3 FB Neck ROM: Full    Dental no notable dental hx.    Pulmonary neg pulmonary ROS,    Pulmonary exam normal breath sounds clear to auscultation       Cardiovascular hypertension, Pt. on medications Normal cardiovascular exam Rhythm:Regular Rate:Normal     Neuro/Psych negative neurological ROS  negative psych ROS   GI/Hepatic negative GI ROS, Neg liver ROS,   Endo/Other  negative endocrine ROS  Renal/GU negative Renal ROS  negative genitourinary   Musculoskeletal negative musculoskeletal ROS (+)   Abdominal   Peds negative pediatric ROS (+)  Hematology negative hematology ROS (+)   Anesthesia Other Findings   Reproductive/Obstetrics negative OB ROS                             Anesthesia Physical Anesthesia Plan  ASA: II  Anesthesia Plan: General   Post-op Pain Management:    Induction: Intravenous  PONV Risk Score and Plan: 2 and Ondansetron, Dexamethasone and Treatment may vary due to age or medical condition  Airway Management Planned: Oral ETT  Additional Equipment:   Intra-op Plan:   Post-operative Plan: Extubation in OR  Informed Consent: I have reviewed the patients History and Physical, chart, labs and discussed the procedure including the risks, benefits and alternatives for the proposed anesthesia with the patient or authorized representative who has indicated his/her understanding and acceptance.   Dental advisory given  Plan Discussed with:   Anesthesia Plan Comments: (  )        Anesthesia Quick Evaluation

## 2016-11-21 NOTE — Anesthesia Postprocedure Evaluation (Signed)
Anesthesia Post Note  Patient: Tracy Smith  Procedure(s) Performed: Procedure(s) (LRB): LAPAROSCOPIC CHOLECYSTECTOMY (N/A)     Patient location during evaluation: PACU Anesthesia Type: General Level of consciousness: awake and alert Pain management: pain level controlled Vital Signs Assessment: post-procedure vital signs reviewed and stable Respiratory status: spontaneous breathing, nonlabored ventilation, respiratory function stable and patient connected to nasal cannula oxygen Cardiovascular status: blood pressure returned to baseline and stable Postop Assessment: no signs of nausea or vomiting Anesthetic complications: no    Last Vitals:  Vitals:   11/21/16 1300 11/21/16 1315  BP:    Pulse: (!) 109 (!) 106  Resp: (!) 23 17  Temp:      Last Pain:  Vitals:   11/21/16 1215  TempSrc:   PainSc: 7                  Rylee Nuzum

## 2016-11-21 NOTE — Transfer of Care (Signed)
Immediate Anesthesia Transfer of Care Note  Patient: Tracy Smith  Procedure(s) Performed: Procedure(s): LAPAROSCOPIC CHOLECYSTECTOMY (N/A)  Patient Location: PACU  Anesthesia Type:General  Level of Consciousness: alert , oriented, drowsy and patient cooperative  Airway & Oxygen Therapy: Patient Spontanous Breathing and Patient connected to nasal cannula oxygen  Post-op Assessment: Report given to RN and Post -op Vital signs reviewed and stable  Post vital signs: Reviewed and stable  Last Vitals:  Vitals:   11/20/16 2233 11/21/16 0521  BP: (!) 152/91 (!) 161/87  Pulse: 95 (!) 107  Resp:  18  Temp: 36.9 C 37.1 C    Last Pain:  Vitals:   11/21/16 0800  TempSrc:   PainSc: 2       Patients Stated Pain Goal: 2 (22/48/25 0037)  Complications: No apparent anesthesia complications

## 2016-11-21 NOTE — Op Note (Signed)
11/17/2016 - 11/21/2016  11:52 AM  PATIENT:  Tracy Smith  61 y.o. male  PRE-OPERATIVE DIAGNOSIS:  Biliary pancreatitis  POST-OPERATIVE DIAGNOSIS:  Biliary pancreatitis  PROCEDURE:  Procedure(s): LAPAROSCOPIC CHOLECYSTECTOMY  SURGEON:  Surgeon(s): Georganna Skeans, MD  ASSISTANTS: none   ANESTHESIA:   local and general  EBL:  Total I/O In: 0  Out: 105 [Other:100; Blood:5]  BLOOD ADMINISTERED:none  DRAINS: none   SPECIMEN:  Excision  DISPOSITION OF SPECIMEN:  PATHOLOGY  COUNTS:  YES  DICTATION: .Dragon Dictation Procedure in detail: [name] presents for cholecystectomy. He was admitted with biliary pancreatitis and his pancreatitis has improved. He received intravenous antibiotics as scheduled. Informed consent was obtained. He was brought to the operating room and general endotracheal anesthesia was administered by the anesthesia staff. His abdomen was prepped and draped in a sterile fashion. We did a time out procedure.The infraumbilical region was infiltrated with local. Supraumbilical incision was made. Subcutaneous tissues were dissected down revealing the anterior fascia. This was divided sharply along the midline. Peritoneal cavity was entered under direct vision without complication. A 0 Vicryl pursestring was placed around the fascial opening. Hassan trocar was inserted into the abdomen. The abdomen was insufflated with carbon dioxide in standard fashion. Under direct vision a 5 mm epigastric and 5 mm right port 2 were placed. Local was used at each port site. Laparoscopic exploration revealed some bowel dilatation. The dome of the gallbladder was retracted superior medially. There was some serous fluid in the right upper quadrant which was evacuated. The infundibulum was retracted inferior laterally. Dissection began laterally and progressed medially. First, we identified the cystic artery. This was clipped twice proximally, once distally and divided. Next the cystic duct was  identified. Dissection continued until we had a critical view around the cystic duct. Due to his body habitus there is limited space up in the right upper quadrant and were not able to do a cholangiogram. 3 clips were placed proximally on the cystic duct and one was placed distally and it was divided. Gallbladder was taken off the liver bed using cautery achieving excellent hemostasis. It was placed in a bag and removed from the abdomen. Abdomen was copiously irrigated. Irrigation fluid was evacuated. The liver bed was rechecked and was dry. Clips remain in good position. Pneumoperitoneum was released. Ports were removed under direct vision. Supraumbilical fascia was closed by tying the pursestring. All 4 wounds were irrigated and the skin of each was closed with running 4-0 Vicryl subcuticular followed by Dermabond. All counts were correct. He tolerated the procedure well without apparent complications and was taken recovery in stable condition.  PATIENT DISPOSITION:  PACU - hemodynamically stable.   Delay start of Pharmacological VTE agent (>24hrs) due to surgical blood loss or risk of bleeding:  no  Georganna Skeans, MD, MPH, FACS Pager: [phone]  7/10/201811:52 AM

## 2016-11-21 NOTE — Progress Notes (Signed)
Report called to [facility] in short stay.

## 2016-11-21 NOTE — Progress Notes (Signed)
Patient off the floor for cholecystectomy + IOC.

## 2016-11-21 NOTE — Anesthesia Procedure Notes (Signed)
Procedure Name: Intubation Date/Time: 11/21/2016 10:57 AM Performed by: Everlean Cherry A Pre-anesthesia Checklist: Patient identified, Emergency Drugs available, Suction available and Patient being monitored Patient Re-evaluated:Patient Re-evaluated prior to inductionOxygen Delivery Method: Circle system utilized Preoxygenation: Pre-oxygenation with 100% oxygen Intubation Type: IV induction Ventilation: Mask ventilation without difficulty and Oral airway inserted - appropriate to patient size Laryngoscope Size: Mac and 3 Grade View: Grade II Tube type: Oral Tube size: 7.5 mm Number of attempts: 1 Airway Equipment and Method: Stylet Placement Confirmation: ETT inserted through vocal cords under direct vision,  positive ETCO2 and breath sounds checked- equal and bilateral Secured at: 23 cm Tube secured with: Tape Dental Injury: Teeth and Oropharynx as per pre-operative assessment

## 2016-11-22 ENCOUNTER — Encounter (HOSPITAL_COMMUNITY): Payer: Self-pay | Admitting: General Surgery

## 2016-11-22 MED ORDER — OXYCODONE HCL 5 MG PO TABS: 5.0000 mg | ORAL_TABLET | ORAL | 0 refills | Status: DC | PRN

## 2016-11-22 NOTE — Progress Notes (Signed)
Patient ambulated again in hall 2 rounds accompanied by wife.  Patient feeling much better after the ambulation and report a small hard stool.  Patient now resting on bed watching TV with minimal pain.  Will monitor.

## 2016-11-22 NOTE — Progress Notes (Signed)
Subjective: Abdomen "tight" but no significant pain. Having flatus and large amounts of stool.  Objective: Vital signs in last 24 hours: Temp:  [97.5 F (36.4 C)-99.4 F (37.4 C)] 98.4 F (36.9 C) (07/11 0407) Pulse Rate:  [91-109] 93 (07/11 0407) Resp:  [13-23] 18 (07/11 0407) BP: (128-155)/(83-106) 154/95 (07/11 0407) SpO2:  [94 %-99 %] 95 % (07/11 0407) Weight change:  Last BM Date: 11/21/16  PE: GEN:  NAD ABD:  Protuberant, mild tenderness, tympanic, no peritonitis  Lab Results: CBC    Component Value Date/Time   WBC 19.4 (H) 11/21/2016 0536   RBC 4.56 11/21/2016 0536   HGB 14.4 11/21/2016 0536   HCT 41.3 11/21/2016 0536   PLT 143 (L) 11/21/2016 0536   MCV 90.6 11/21/2016 0536   MCH 31.6 11/21/2016 0536   MCHC 34.9 11/21/2016 0536   RDW 13.2 11/21/2016 0536   LYMPHSABS 1.7 11/19/2016 0441   MONOABS 2.0 (H) 11/19/2016 0441   EOSABS 0.0 11/19/2016 0441   BASOSABS 0.0 11/19/2016 0441   CMP     Component Value Date/Time   NA 137 11/21/2016 0536   K 3.3 (L) 11/21/2016 0536   CL 105 11/21/2016 0536   CO2 22 11/21/2016 0536   GLUCOSE 99 11/21/2016 0536   BUN 13 11/21/2016 0536   CREATININE 0.97 11/21/2016 0536   CALCIUM 7.8 (L) 11/21/2016 0536   PROT 6.1 (L) 11/21/2016 0536   ALBUMIN 2.8 (L) 11/21/2016 0536   AST 26 11/21/2016 0536   ALT 74 (H) 11/21/2016 0536   ALKPHOS 66 11/21/2016 0536   BILITOT 1.9 (H) 11/21/2016 0536   GFRNONAA >60 11/21/2016 0536   GFRAA >60 11/21/2016 0536   Assessment:  1.  Gallstone pancreatitis, cholecystectomy yesterday (IOC not done due to anatomic constraints). 2.  Elevated LFTs, likely from #1 above, downtrending. 3.  Abdominal distention, likely ileus (from pancreatitis and possibly post-operative component); passing stool and flatus.  Plan:  1.  Given downtrend of LFTs, would not pursue ERCP. 2.  Continued ileus management (minimize narcotics as possible; mobilize; correct hypokalemia). 3.  No further GI input at this  time; will sign-off; please call with questions.  Will arrange outpatient follow-up with Dr. Amedeo Plenty.  Landry Dyke 11/22/2016, 10:23 AM   Pager [phone] If no answer or after 5 PM call [phone]

## 2016-11-22 NOTE — Discharge Summary (Signed)
[facility] Discharge Summary   Patient ID: Tracy Smith MRN: [id number] DOB/AGE: 1955/06/25 61 y.o.  Admit date: 11/17/2016 Discharge date: 11/22/2016  Admitting Diagnosis: Hyperbilirubinemia Transaminases  Discharge Diagnosis Gallstone Pancreatitis - resolved Symptomatic cholelithiasis - s/p laparoscopic cholecystectomy  Consultants Gastroenterology - Dr. Amedeo Plenty  Imaging: No results found.  Procedures Dr. Grandville Silos (11/21/16) - Laparoscopic Cholecystectomy   Hospital Course:  61 y.o. male who presented to [facility] with RUQ pain, nausea and vomiting. Workup showed elevated bilirubin and LFTs with sludge in gallbladder on RUQ [place]. GI was consulted for evaluation of possible CBD stones. Patient was admitted to the general surgery service. MRCP done 11/18/16 by Dr. Amedeo Plenty and no CBD stones or dilatation, but did show peripancreatic edema. Patient had an increase in lipase into the 800s and worsened pain after MRCP. Consistent with gallstone pancreatitis, given hydration and monitored. Once pancreatitis had improved, patient underwent above listed procedure. Tolerated procedure well and was transferred to the floor.  Diet was advanced as tolerated.  On POD#1, the patient was voiding well, tolerating diet, ambulating well, pain well controlled, vital signs stable, incisions c/d/i and felt stable for discharge home.  Patient will follow up in our office in 2 weeks and knows to call with questions or concerns.  He will call to confirm appointment date/time.    Physical Exam: General:  Alert, NAD, pleasant, comfortable Chest: RRR, normal effort of breathing, CTAB Abd:  Soft, ND, mild tenderness, incisions C/D/I Skin: warm and dry  Allergies as of 11/22/2016      Reactions   No Known Allergies       Medication List    TAKE these medications   amLODipine-benazepril 5-40 MG capsule Commonly known as:  LOTREL Take 1 capsule by mouth every evening.   aspirin EC 81 MG tablet Take 81  mg by mouth every evening.   esomeprazole 40 MG capsule Commonly known as:  NEXIUM Take 40 mg by mouth every morning.   montelukast 10 MG tablet Commonly known as:  SINGULAIR Take 10 mg by mouth every evening.   oxyCODONE 5 MG immediate release tablet Commonly known as:  Oxy IR/ROXICODONE Take 1-2 tablets (5-10 mg total) by mouth every 4 (four) hours as needed for moderate pain or severe pain.   simvastatin 20 MG tablet Commonly known as:  ZOCOR Take 20 mg by mouth every evening.        Follow-up Information    [facility], [place]. Go on 12/05/2016.   Specialty:  General Surgery Why:  Your appointment is at 9:25 am. Please arrive 30 min prior to appointment time. Bring photo ID and insurance information.  Contact information: [address] [place] [address] [phone]           Signed: Brigid Re , [facility] 11/22/2016, 10:27 AM Pager: [phone] Consults: [phone] Mon-Fri 7:00 am-4:30 pm Sat-Sun 7:00 am-11:30 am

## 2016-11-22 NOTE — Discharge Instructions (Signed)
Your appointment is at 9:25 AM on 12/05/16, please arrive at least 30 min before your appointment to complete your check in paperwork.  If you are unable to arrive 30 min prior to your appointment time we may have to cancel or reschedule you.  LAPAROSCOPIC SURGERY: POST OP INSTRUCTIONS  1. DIET: Follow a light bland diet the first 24 hours after arrival home, such as soup, liquids, crackers, etc. Be sure to include lots of fluids daily. Avoid fast food or heavy meals as your are more likely to get nauseated. Eat a low fat the next few days after surgery.  2. Take your usually prescribed home medications unless otherwise directed. 3. PAIN CONTROL:  1. Pain is best controlled by a usual combination of three different methods TOGETHER:  1. Ice/Heat 2. Over the counter pain medication 3. Prescription pain medication 2. Most patients will experience some swelling and bruising around the incisions. Ice packs or heating pads (30-60 minutes up to 6 times a day) will help. Use ice for the first few days to help decrease swelling and bruising, then switch to heat to help relax tight/sore spots and speed recovery. Some people prefer to use ice alone, heat alone, alternating between ice & heat. Experiment to what works for you. Swelling and bruising can take several weeks to resolve.  3. It is helpful to take an over-the-counter pain medication regularly for the first few weeks. Choose one of the following that works best for you:  1. Naproxen (Aleve, etc) Two 220mg  tabs twice a day 2. Ibuprofen (Advil, etc) Three 200mg  tabs four times a day (every meal & bedtime) 3. Acetaminophen (Tylenol, etc) 500-650mg  four times a day (every meal & bedtime) 4. A prescription for pain medication (such as oxycodone, hydrocodone, etc) should be given to you upon discharge. Take your pain medication as prescribed.  1. If you are having problems/concerns with the prescription medicine (does not control pain, nausea, vomiting,  rash, itching, etc), please call us [phone] to see if we need to switch you to a different pain medicine that will work better for you and/or control your side effect better. 2. If you need a refill on your pain medication, please contact your pharmacy. They will contact our office to request authorization. Prescriptions will not be filled after 5 pm or on week-ends. 4. Avoid getting constipated. Between the surgery and the pain medications, it is common to experience some constipation. Increasing fluid intake and taking a fiber supplement (such as Metamucil, Citrucel, FiberCon, MiraLax, etc) 1-2 times a day regularly will usually help prevent this problem from occurring. A mild laxative (prune juice, Milk of Magnesia, MiraLax, etc) should be taken according to package directions if there are no bowel movements after 48 hours.  5. Watch out for diarrhea. If you have many loose bowel movements, simplify your diet to bland foods & liquids for a few days. Stop any stool softeners and decrease your fiber supplement. Switching to mild anti-diarrheal medications (Kayopectate, Pepto Bismol) can help. If this worsens or does not improve, please call us. 6. Wash / shower every day. You may shower over the dressings as they are waterproof. Continue to shower over incision(s) after the dressing is off. 7. Remove your waterproof bandages 5 days after surgery. You may leave the incision open to air. You may replace a dressing/Band-Aid to cover the incision for comfort if you wish.  8. ACTIVITIES as tolerated:  1. You may resume regular (light) daily activities beginning the  next day--such as daily self-care, walking, climbing stairs--gradually increasing activities as tolerated. If you can walk 30 minutes without difficulty, it is safe to try more intense activity such as jogging, treadmill, bicycling, low-impact aerobics, swimming, etc. 2. Save the most intensive and strenuous activity for last such as sit-ups,  heavy lifting, contact sports, etc Refrain from any heavy lifting or straining until you are off narcotics for pain control.  3. DO NOT PUSH THROUGH PAIN. Let pain be your guide: If it hurts to do something, don't do it. Pain is your body warning you to avoid that activity for another week until the pain goes down. 4. You may drive when you are no longer taking prescription pain medication, you can comfortably wear a seatbelt, and you can safely maneuver your car and apply brakes. 5. You may have sexual intercourse when it is comfortable.  9. FOLLOW UP in our office  1. Please call [facility] at [phone] to set up an appointment to see your surgeon in the office for a follow-up appointment approximately 2-3 weeks after your surgery. 2. Make sure that you call for this appointment the day you arrive home to insure a convenient appointment time.      10. IF YOU HAVE DISABILITY OR FAMILY LEAVE FORMS, BRING THEM TO THE OFFICE FOR PROCESSING.   WHEN TO CALL us [phone]:  1. Poor pain control 2. Reactions / problems with new medications (rash/itching, nausea, etc)  3. Fever over 101.5 F (38.5 C) 4. Inability to urinate 5. Nausea and/or vomiting 6. Worsening swelling or bruising 7. Continued bleeding from incision. 8. Increased pain, redness, or drainage from the incision  The clinic staff is available to answer your questions during regular business hours (8:30am-5pm). Please dont hesitate to call and ask to speak to one of our nurses for clinical concerns.  If you have a medical emergency, go to the nearest emergency room or call 911.  A surgeon from [facility] is always on call at the [facility], [place], [place], [place], [place] [address] ?  MAIN: [phone] ? TOLL FREE: [phone] ?  FAX [phone]  [email or web address]

## 2016-11-22 NOTE — Progress Notes (Signed)
Discharge instructions reviewed with pt and wife.  Copy of instructions and script given to pt.  Pt d/c'd via wheelchair with belongings.            Escorted by hospital volunteer.   Pt had a few small spots of blood on his shirt, noted to come from umbilicus incision. Placed a folded 2x2 gauze and opsite dsg over incision area. Provided pt with few gauze dsgs and tape if needed, wife instructed. Pt also educated when to call MD.  Pt instructed okay to shower tomorrow and remove that dsg after shower tomorrow. Pt verbalized understanding.  Pt discharged.

## 2017-05-17 ENCOUNTER — Telehealth: Payer: Self-pay | Admitting: Family Medicine

## 2017-05-17 ENCOUNTER — Ambulatory Visit (INDEPENDENT_AMBULATORY_CARE_PROVIDER_SITE_OTHER): Payer: Managed Care, Other (non HMO) | Admitting: Family Medicine

## 2017-05-17 ENCOUNTER — Encounter: Payer: Self-pay | Admitting: Family Medicine

## 2017-05-17 VITALS — BP 122/88 | HR 100 | Temp 98.2°F | Ht 68.5 in | Wt 218.5 lb

## 2017-05-17 DIAGNOSIS — I10 Essential (primary) hypertension: Secondary | ICD-10-CM

## 2017-05-17 DIAGNOSIS — E785 Hyperlipidemia, unspecified: Secondary | ICD-10-CM | POA: Diagnosis not present

## 2017-05-17 DIAGNOSIS — J302 Other seasonal allergic rhinitis: Secondary | ICD-10-CM

## 2017-05-17 MED ORDER — SIMVASTATIN 20 MG PO TABS: 20.0000 mg | ORAL_TABLET | Freq: Every evening | ORAL | 3 refills | Status: DC

## 2017-05-17 NOTE — Progress Notes (Signed)
Chief Complaint  Patient presents with  . Establish Care       New Patient Visit SUBJECTIVE: HPI: Tracy Smith is an 62 y.o.male who is being seen for establishing care.  The patient was previously seen at [facility] but this location is more convenient.  Hypertension Patient presents for hypertension follow up. He does routinelymonitor home blood pressures. He is compliant with medications. Patient has these side effects of medication: none He is adhering to a healthy diet overall. Exercise: Works on bag, starting to walk +famhx of HTN  He has a history of seasonal allergies.  He is currently on Singulair.  When I asked him why he is on Singulair, he was wondering if there is anything better he should take.  Reports compliance as well.   Hyperlipidemia  Patient presents for dyslipidemia follow up. Currently being treated with Zocor 20 mg/d and compliance with treatment thus far has been good. He denies myalgias. He is adhering to a healthy. The patient exercises frequently.  The patient is not known to have coexisting coronary artery disease.  Allergies  Allergen Reactions  . No Known Allergies     Past Medical History:  Diagnosis Date  . Cholecystitis 11/2016  . High cholesterol   . Hypertension    Past Surgical History:  Procedure Laterality Date  . CHOLECYSTECTOMY N/A 11/21/2016   Procedure: LAPAROSCOPIC CHOLECYSTECTOMY;  Surgeon: Georganna Skeans, MD;  Location: [facility];  Service: General;  Laterality: N/A;  . COLONOSCOPY     Social History   Socioeconomic History  . Marital status: Married  Tobacco Use  . Smoking status: Never Smoker  . Smokeless tobacco: Never Used  Substance and Sexual Activity  . Alcohol use: No  . Drug use: No   Family History  Problem Relation Age of Onset  . High blood pressure Father        Current Outpatient Medications:  .  amLODipine-benazepril (LOTREL) 5-40 MG capsule, Take 1 capsule by mouth every evening., Disp: , Rfl:  .   aspirin EC 81 MG tablet, Take 81 mg by mouth every evening. , Disp: , Rfl:  .  montelukast (SINGULAIR) 10 MG tablet, Take 10 mg by mouth every evening., Disp: , Rfl:  .  simvastatin (ZOCOR) 20 MG tablet, Take 1 tablet (20 mg total) by mouth every evening., Disp: 90 tablet, Rfl: 3    ROS Cardiovascular: Denies chest pain  Respiratory: Denies dyspnea   OBJECTIVE: BP 122/88 (BP Location: Left Arm, Patient Position: Sitting, Cuff Size: Large)   Pulse 100   Temp 98.2 F (36.8 C) (Oral)   Ht 5' 8.5" (1.74 m)   Wt 218 lb 8 oz (99.1 kg)   SpO2 95%   BMI 32.74 kg/m   Constitutional: -  VS reviewed -  Well developed, well nourished, appears stated age -  No apparent distress  Psychiatric: -  Oriented to person, place, and time -  Memory intact -  Affect and mood normal -  Fluent conversation, good eye contact -  Judgment and insight age appropriate  Eye: -  Conjunctivae clear, no discharge -  Pupils symmetric, round, reactive to light  ENMT: -  MMM    Pharynx moist, no exudate, no erythema  Neck: -  No gross swelling, no palpable masses -  Thyroid midline, not enlarged, mobile, no palpable masses  Cardiovascular: -  RRR -  No bruits -  No LE edema  Respiratory: -  Normal respiratory effort, no accessory muscle use, no retraction -  Breath sounds equal, no wheezes, no ronchi, no crackles  Musculoskeletal: -  No clubbing, no cyanosis -  Gait normal  Skin: -  No significant lesion on inspection -  Warm and dry to palpation   ASSESSMENT/PLAN: Essential hypertension  Patient instructed to sign release of records form from his previous PCP. Cont BP meds.  Refill Zocor. Try PO antihistamines. Try to come off Singulair.  Patient should return in 6 mo. The patient voiced understanding and agreement to the plan.   [place], DO 05/17/17  4:02 PM

## 2017-05-17 NOTE — Telephone Encounter (Signed)
Pt would like a 90 day supply of Benazepril and Montelukast to be sent to [facility] in [place].Pt asking for medication to be filled for 6 months until seen for appt in July. Medication not previously ordered by Dr. Nani Ravens. Pt seen for OV today  With Dr. Nani Ravens.

## 2017-05-17 NOTE — Patient Instructions (Addendum)
Claritin (loratadine), Allegra (fexofenadine), Zyrtec (cetirizine); these are listed in order from weakest to strongest. Generic, and therefore cheaper, options are in the parentheses.   Flonase (fluticasone); nasal spray that is over the counter. 2 sprays each nostril, once daily. Aim towards the same side eye when you spray.  There are available OTC, and the generic versions, which may be cheaper, are in parentheses. Show this to a pharmacist if you have trouble finding any of these items.  You can try to come off of the Singulair.    Let us know if you need anything.

## 2017-05-17 NOTE — Progress Notes (Signed)
Pre visit review using our clinic review tool, if applicable. No additional management support is needed unless otherwise documented below in the visit note. 

## 2017-05-17 NOTE — Telephone Encounter (Signed)
Copied from [place] [id number]. Topic: Quick Communication - See Telephone Encounter >> May 17, 2017  4:21 PM Bea Graff, NT wrote: CRM for notification. See Telephone encounter for: Pt needing a refill of amLODipine-benazepril (LOTREL) 90 day supply. As well asmontelukast (SINGULAIR)  .  [facility] in [place]  05/17/17.

## 2017-05-17 NOTE — Telephone Encounter (Signed)
Would like to know if they can have enough refills to last for at least 6 months until pt returns in July.

## 2017-05-18 MED ORDER — MONTELUKAST SODIUM 10 MG PO TABS: 10.0000 mg | ORAL_TABLET | Freq: Every evening | ORAL | 2 refills | Status: DC

## 2017-05-18 MED ORDER — AMLODIPINE BESY-BENAZEPRIL HCL 5-40 MG PO CAPS: 1.0000 | ORAL_CAPSULE | Freq: Every evening | ORAL | 1 refills | Status: DC

## 2017-06-27 ENCOUNTER — Encounter: Payer: Self-pay | Admitting: Family Medicine

## 2017-06-27 ENCOUNTER — Ambulatory Visit (INDEPENDENT_AMBULATORY_CARE_PROVIDER_SITE_OTHER): Payer: Managed Care, Other (non HMO) | Admitting: Family Medicine

## 2017-06-27 VITALS — BP 118/78 | HR 84 | Temp 98.1°F | Ht 68.5 in | Wt 217.2 lb

## 2017-06-27 DIAGNOSIS — L309 Dermatitis, unspecified: Secondary | ICD-10-CM

## 2017-06-27 DIAGNOSIS — I10 Essential (primary) hypertension: Secondary | ICD-10-CM | POA: Diagnosis not present

## 2017-06-27 DIAGNOSIS — E785 Hyperlipidemia, unspecified: Secondary | ICD-10-CM

## 2017-06-27 LAB — COMPREHENSIVE METABOLIC PANEL
ALBUMIN: 4.8 g/dL (ref 3.5–5.2)
ALK PHOS: 50 U/L (ref 39–117)
ALT: 27 U/L (ref 0–53)
AST: 24 U/L (ref 0–37)
BILIRUBIN TOTAL: 0.9 mg/dL (ref 0.2–1.2)
BUN: 19 mg/dL (ref 6–23)
CO2: 31 mEq/L (ref 19–32)
CREATININE: 1.14 mg/dL (ref 0.40–1.50)
Calcium: 9.7 mg/dL (ref 8.4–10.5)
Chloride: 101 mEq/L (ref 96–112)
GFR: 69.71 mL/min (ref >60.00)
GLUCOSE: 92 mg/dL (ref 70–99)
POTASSIUM: 4.4 meq/L (ref 3.5–5.1)
SODIUM: 139 meq/L (ref 135–145)
TOTAL PROTEIN: 7.8 g/dL (ref 6.0–8.3)

## 2017-06-27 LAB — LIPID PANEL
CHOLESTEROL: 163 mg/dL (ref 0–200)
HDL: 45.6 mg/dL (ref >39.00)
LDL Cholesterol: 97 mg/dL (ref 0–99)
NONHDL: 117.37
Total CHOL/HDL Ratio: 4
Triglycerides: 101 mg/dL (ref 0.0–149.0)
VLDL: 20.2 mg/dL (ref 0.0–40.0)

## 2017-06-27 MED ORDER — TRIAMCINOLONE ACETONIDE 0.1 % EX CREA: 1.0000 "application " | TOPICAL_CREAM | Freq: Two times a day (BID) | CUTANEOUS | 0 refills | Status: DC

## 2017-06-27 NOTE — Progress Notes (Signed)
Chief Complaint  Patient presents with  . Rash    Tracy Smith is a 62 y.o. male here for a skin complaint.  Duration: 2 weeks Location: perianal region Pruritic? Yes Painful? Yes Drainage? No New soaps/lotions/topicals/detergents? No- alcohol wipes made things worse after scratching Sick contacts? No Other associated symptoms: none Therapies tried thus far: vaseline helped a little  ROS:  Const: No fevers Skin: As noted in HPI  Past Medical History:  Diagnosis Date  . Cholecystitis 11/2016  . High cholesterol   . Hypertension    Allergies  Allergen Reactions  . No Known Allergies    Allergies as of 06/27/2017      Reactions   No Known Allergies       Medication List        Accurate as of 06/27/17  1:50 PM. Always use your most recent med list.          amLODipine-benazepril 5-40 MG capsule Commonly known as:  LOTREL Take 1 capsule by mouth every evening.   aspirin EC 81 MG tablet Take 81 mg by mouth every evening.   montelukast 10 MG tablet Commonly known as:  SINGULAIR Take 1 tablet (10 mg total) by mouth every evening.   simvastatin 20 MG tablet Commonly known as:  ZOCOR Take 1 tablet (20 mg total) by mouth every evening.   triamcinolone cream 0.1 % Commonly known as:  KENALOG Apply 1 application topically 2 (two) times daily.       BP 118/78 (BP Location: Left Arm, Patient Position: Sitting, Cuff Size: Large)   Pulse 84   Temp 98.1 F (36.7 C) (Oral)   Ht 5' 8.5" (1.74 m)   Wt 217 lb 4 oz (98.5 kg)   SpO2 98%   BMI 32.55 kg/m  Gen: awake, alert, appearing stated age Lungs: No accessory muscle use Skin: macerated skin around buttock and perineal region. No drainage, fluctuance, excoriation Psych: Age appropriate judgment and insight  Dermatitis - Plan: triamcinolone cream (KENALOG) 0.1 %  Hyperlipidemia, unspecified hyperlipidemia type - Plan: Lipid panel  Essential hypertension - Plan: Comprehensive metabolic panel  Orders as  above. Cont emollient. Try steroid cream. Avoid alcohol wipes. Warning s/s's verbalized and written.  F/u prn. The patient voiced understanding and agreement to the plan.  [place], DO 06/27/17 1:50 PM

## 2017-06-27 NOTE — Progress Notes (Signed)
Pre visit review using our clinic review tool, if applicable. No additional management support is needed unless otherwise documented below in the visit note. 

## 2017-06-27 NOTE — Patient Instructions (Signed)
OK to use up to 14 days twice daily.  Continue to use lotion/Vaseline at least twice daily.  Things to look out for: increasing pain not relieved by ibuprofen/acetaminophen, fevers, spreading redness, drainage of pus, or foul odor.

## 2017-07-27 ENCOUNTER — Encounter: Payer: Self-pay | Admitting: Family Medicine

## 2017-07-27 ENCOUNTER — Ambulatory Visit (INDEPENDENT_AMBULATORY_CARE_PROVIDER_SITE_OTHER): Payer: Managed Care, Other (non HMO) | Admitting: Family Medicine

## 2017-07-27 VITALS — BP 112/82 | HR 94 | Temp 98.2°F | Ht 68.5 in | Wt 220.0 lb

## 2017-07-27 DIAGNOSIS — F4323 Adjustment disorder with mixed anxiety and depressed mood: Secondary | ICD-10-CM

## 2017-07-27 NOTE — Patient Instructions (Signed)
Please consider counseling. Contact [phone] to schedule an appointment or inquire about cost/insurance coverage.  Coping skills Choose 5 that work for you:  Take a deep breath  Count to 20  Read a book  Do a puzzle  Meditate  Bake  Sing  Knit  Garden  Pray  Go outside  Call a friend  Listen to music  Take a walk  Color  Send a note  Take a bath  Watch a movie  Be alone in a quiet place  Pet an animal  Visit a friend  Journal  Exercise  Stretch   Let us know if you need anything.  

## 2017-07-27 NOTE — Progress Notes (Signed)
Pre visit review using our clinic review tool, if applicable. No additional management support is needed unless otherwise documented below in the visit note. 

## 2017-07-27 NOTE — Progress Notes (Signed)
Chief Complaint  Patient presents with  . Depression  . Anxiety    Subjective Tracy Smith is an 62 y.o. male who presents with anxiety/depression  The patient's father passed away around 3 years ago.  Ever since then, his wife feels he has never been the same as they had a very close relationship.  Over the past couple months, the patient has been experiencing more stress at work as he feels he has been given more and more responsibility.  Since that time, he has been having poor sleep, increased irritability, anhedonia, depressed mood.  He was on Zoloft in the past for a short period of time.  He is also undergone counseling as well.  He feels irritability is his biggest issue.  He states that he takes things home from work and is on edge.  He has not and his wife verbally on several occasions over seemingly trivial issues.  When he reflects, he knows that he is in the wrong and would like to do something about this.  He is most interested in counseling and would like to avoid a medicine if possible.  He denies any homicidal or suicidal ideation.  He is not self medicating.  Exam BP 112/82 (BP Location: Left Arm, Patient Position: Sitting, Cuff Size: Large)   Pulse 94   Temp 98.2 F (36.8 C) (Oral)   Ht 5' 8.5" (1.74 m)   Wt 220 lb (99.8 kg)   SpO2 97%   BMI 32.96 kg/m   Assessment and Plan  Situational mixed anxiety and depressive disorder  Offered medication.  He would like to proceed with counseling. Number for [facility] provided in AVS. F/u prn at this point. Patient voiced understanding and agreement to the plan.  Greater than 25 minutes were spent face to face with the patient with greater than 50% of this time spent counseling on above dx etiology, prognosis, treatment options such as medication and counseling, and coping skills.    [place], DO 07/27/17 4:50 PM

## 2017-09-07 ENCOUNTER — Other Ambulatory Visit: Payer: Self-pay | Admitting: Family Medicine

## 2017-09-07 MED ORDER — AMLODIPINE BESY-BENAZEPRIL HCL 5-40 MG PO CAPS: 1.0000 | ORAL_CAPSULE | Freq: Every evening | ORAL | 1 refills | Status: DC

## 2017-10-05 ENCOUNTER — Other Ambulatory Visit: Payer: Self-pay | Admitting: Family Medicine

## 2017-10-05 DIAGNOSIS — E785 Hyperlipidemia, unspecified: Secondary | ICD-10-CM

## 2017-10-05 MED ORDER — SIMVASTATIN 20 MG PO TABS: 20.0000 mg | ORAL_TABLET | Freq: Every evening | ORAL | 3 refills | Status: DC

## 2017-10-05 NOTE — Telephone Encounter (Signed)
Called pt regarding his refills for amlodipine-benazepril 5-40 mg cap, montelukast 10 mg tab and simvastatin 20 mg tab for possible transfer of prescriptions.  He said his wife takes care of his meds and she is on the [facility]. Notified pt's wife and said that the only med that he really needed a refill on was the simvastatin. And they are changing pharmacies to [facility] mail order.  NOV  11/14/17 LOV  07/27/17  Dr.Wendling

## 2017-10-05 NOTE — Telephone Encounter (Signed)
Copied from [place] [phone] [phone]. Topic: Quick Communication - Rx Refill/Question >> Oct 05, 2017 10:31 AM Mcneil, Jacinto Reap wrote: Medication: simvastatin (ZOCOR) 20 MG tablet,  montelukast (SINGULAIR) 10 MG tablet, and  amLODipine-benazepril (LOTREL) 5-40 MG capsule  Preferred Pharmacy (with phone number or street name): [facility] [address] [place] [address]  Ph# [phone]    Agent: Please be advised that RX refills may take up to 3 business days. We ask that you follow-up with your pharmacy.

## 2017-11-14 ENCOUNTER — Encounter: Payer: Self-pay | Admitting: Family Medicine

## 2017-11-14 ENCOUNTER — Encounter: Payer: Self-pay | Admitting: Gastroenterology

## 2017-11-14 ENCOUNTER — Ambulatory Visit (INDEPENDENT_AMBULATORY_CARE_PROVIDER_SITE_OTHER): Payer: Managed Care, Other (non HMO) | Admitting: Family Medicine

## 2017-11-14 VITALS — BP 118/80 | HR 83 | Temp 98.4°F | Ht 69.0 in | Wt 217.0 lb

## 2017-11-14 DIAGNOSIS — E785 Hyperlipidemia, unspecified: Secondary | ICD-10-CM

## 2017-11-14 DIAGNOSIS — Z1159 Encounter for screening for other viral diseases: Secondary | ICD-10-CM

## 2017-11-14 DIAGNOSIS — Z1211 Encounter for screening for malignant neoplasm of colon: Secondary | ICD-10-CM | POA: Diagnosis not present

## 2017-11-14 DIAGNOSIS — I10 Essential (primary) hypertension: Secondary | ICD-10-CM

## 2017-11-14 LAB — LIPID PANEL
Cholesterol: 162 mg/dL (ref 0–200)
HDL: 46.5 mg/dL (ref >39.00)
LDL Cholesterol: 98 mg/dL (ref 0–99)
NonHDL: 115.69
TRIGLYCERIDES: 87 mg/dL (ref 0.0–149.0)
Total CHOL/HDL Ratio: 3
VLDL: 17.4 mg/dL (ref 0.0–40.0)

## 2017-11-14 LAB — COMPREHENSIVE METABOLIC PANEL
ALBUMIN: 4.6 g/dL (ref 3.5–5.2)
ALK PHOS: 44 U/L (ref 39–117)
ALT: 23 U/L (ref 0–53)
AST: 20 U/L (ref 0–37)
BILIRUBIN TOTAL: 0.8 mg/dL (ref 0.2–1.2)
BUN: 18 mg/dL (ref 6–23)
CO2: 30 mEq/L (ref 19–32)
CREATININE: 1.19 mg/dL (ref 0.40–1.50)
Calcium: 9.4 mg/dL (ref 8.4–10.5)
Chloride: 103 mEq/L (ref 96–112)
GFR: 66.26 mL/min (ref >60.00)
Glucose, Bld: 112 mg/dL — ABNORMAL HIGH (ref 70–99)
Potassium: 4.5 mEq/L (ref 3.5–5.1)
SODIUM: 141 meq/L (ref 135–145)
TOTAL PROTEIN: 6.7 g/dL (ref 6.0–8.3)

## 2017-11-14 MED ORDER — SIMVASTATIN 20 MG PO TABS: 20.0000 mg | ORAL_TABLET | Freq: Every evening | ORAL | 3 refills | Status: DC

## 2017-11-14 MED ORDER — AMLODIPINE BESY-BENAZEPRIL HCL 5-40 MG PO CAPS: 1.0000 | ORAL_CAPSULE | Freq: Every evening | ORAL | 3 refills | Status: DC

## 2017-11-14 MED ORDER — MONTELUKAST SODIUM 10 MG PO TABS: 10.0000 mg | ORAL_TABLET | Freq: Every evening | ORAL | 3 refills | Status: DC

## 2017-11-14 NOTE — Patient Instructions (Addendum)
Keep up the good work.  If you do not hear anything about your referral in the next 1-2 weeks, call our office and ask for an update.  Give [place] 2-3 business days to get the results of your labs back. If labs are normal, you will likely receive a letter in the mail unless you have MyChart. This can take longer than 2-3 business days.   Let us know if you need anything.

## 2017-11-14 NOTE — Progress Notes (Signed)
Chief Complaint  Patient presents with  . Follow-up    Subjective Tracy Smith is a 62 y.o. male who presents for hypertension follow up. He does not monitor home blood pressures. He is compliant with medications. Patient has these side effects of medication: none He is adhering to a healthy diet overall. Current exercise: Punching/kicking bag, some walking  Hyperlipidemia Patient presents for dyslipidemia follow up. Currently being treated with Zocor 20 mg/d and compliance with treatment thus far has been good. He denies myalgias. The patient exercises routinely.  The patient is not known to have coexisting coronary artery disease.    Past Medical History:  Diagnosis Date  . Cholecystitis 11/2016  . High cholesterol   . Hypertension     Review of Systems Cardiovascular: no chest pain Respiratory:  no shortness of breath  Exam BP 118/80 (BP Location: Left Arm, Patient Position: Sitting, Cuff Size: Large)   Pulse 83   Temp 98.4 F (36.9 C) (Oral)   Ht 5\' 9"  (1.753 m)   Wt 217 lb (98.4 kg)   SpO2 96%   BMI 32.05 kg/m  General:  well developed, well nourished, in no apparent distress Heart: RRR, no bruits, no LE edema Lungs: clear to auscultation, no accessory muscle use Psych: well oriented with normal range of affect and appropriate judgment/insight  Essential hypertension - Plan: Comprehensive metabolic panel  Hyperlipidemia, unspecified hyperlipidemia type - Plan: simvastatin (ZOCOR) 20 MG tablet, Lipid panel  Encounter for hepatitis C screening test for low risk patient - Plan: Hepatitis C antibody  Screen for colon cancer - Plan: Ambulatory referral to Gastroenterology  Orders as above. Counseled on diet and exercise. He is doing pretty well.  Just turned 62, due for colonoscopy. Ck Hep C. F/u in 6 mo for CPE. The patient voiced understanding and agreement to the plan.  [place], DO 11/14/17  7:21 AM

## 2017-11-14 NOTE — Progress Notes (Signed)
Pre visit review using our clinic review tool, if applicable. No additional management support is needed unless otherwise documented below in the visit note. 

## 2017-11-15 LAB — HEPATITIS C ANTIBODY
Hepatitis C Ab: NONREACTIVE
SIGNAL TO CUT-OFF: 0.01 (ref <1.00)

## 2017-11-29 ENCOUNTER — Ambulatory Visit (AMBULATORY_SURGERY_CENTER): Payer: Self-pay

## 2017-11-29 ENCOUNTER — Other Ambulatory Visit: Payer: Self-pay

## 2017-11-29 VITALS — Ht 68.5 in | Wt 217.0 lb

## 2017-11-29 DIAGNOSIS — Z1211 Encounter for screening for malignant neoplasm of colon: Secondary | ICD-10-CM

## 2017-11-29 MED ORDER — CLENPIQ 10-3.5-12 MG-GM -GM/160ML PO SOLN: 1.0000 | Freq: Once | ORAL | 0 refills | Status: AC

## 2017-11-29 NOTE — Progress Notes (Signed)
No egg or soy allergy known to patient  No issues with past sedation with any surgeries  or procedures, no intubation problems  No diet pills per patient No home 02 use per patient  No blood thinners per patient  Pt denies issues with constipation  No A fib or A flutter  EMMI video sent to pt's e mail , pt declined    

## 2017-11-30 ENCOUNTER — Encounter: Payer: Self-pay | Admitting: Gastroenterology

## 2017-12-13 ENCOUNTER — Encounter: Payer: Self-pay | Admitting: Gastroenterology

## 2017-12-13 ENCOUNTER — Ambulatory Visit (AMBULATORY_SURGERY_CENTER): Payer: Managed Care, Other (non HMO) | Admitting: Gastroenterology

## 2017-12-13 VITALS — BP 120/66 | HR 65 | Temp 98.9°F | Resp 12 | Ht 68.5 in | Wt 217.0 lb

## 2017-12-13 DIAGNOSIS — K635 Polyp of colon: Secondary | ICD-10-CM | POA: Diagnosis not present

## 2017-12-13 DIAGNOSIS — D125 Benign neoplasm of sigmoid colon: Secondary | ICD-10-CM

## 2017-12-13 DIAGNOSIS — Z1211 Encounter for screening for malignant neoplasm of colon: Secondary | ICD-10-CM

## 2017-12-13 MED ORDER — SODIUM CHLORIDE 0.9 % IV SOLN: 500.0000 mL | Freq: Once | INTRAVENOUS | Status: DC

## 2017-12-13 NOTE — Progress Notes (Signed)
Pt. Asleep when Dr. Lyndel Safe came in the recovery room to give his report.  Care partner had not yet come in from the waiting room.  Dr. To return after his next procedure completed, hence delay in discharge time.

## 2017-12-13 NOTE — Progress Notes (Signed)
Pt's states no medical or surgical changes since previsit or office visit. 

## 2017-12-13 NOTE — Patient Instructions (Signed)
Impression/Recommendations:  Polyp handout given to patient.  Resume previous diet. Continue present medications.  Repeat colonoscopy for surveillance.  Date to be determined after pathology results reviewed.  YOU HAD AN ENDOSCOPIC PROCEDURE TODAY AT [facility]:   Refer to the procedure report that was given to you for any specific questions about what was found during the examination.  If the procedure report does not answer your questions, please call your gastroenterologist to clarify.  If you requested that your care partner not be given the details of your procedure findings, then the procedure report has been included in a sealed envelope for you to review at your convenience later.  YOU SHOULD EXPECT: Some feelings of bloating in the abdomen. Passage of more gas than usual.  Walking can help get rid of the air that was put into your GI tract during the procedure and reduce the bloating. If you had a lower endoscopy (such as a colonoscopy or flexible sigmoidoscopy) you may notice spotting of blood in your stool or on the toilet paper. If you underwent a bowel prep for your procedure, you may not have a normal bowel movement for a few days.  Please Note:  You might notice some irritation and congestion in your nose or some drainage.  This is from the oxygen used during your procedure.  There is no need for concern and it should clear up in a day or so.  SYMPTOMS TO REPORT IMMEDIATELY:   Following lower endoscopy (colonoscopy or flexible sigmoidoscopy):  Excessive amounts of blood in the stool  Significant tenderness or worsening of abdominal pains  Swelling of the abdomen that is new, acute  Fever of 100F or higher For urgent or emergent issues, a gastroenterologist can be reached at any hour by calling [phone].   DIET:  We do recommend a small meal at first, but then you may proceed to your regular diet.  Drink plenty of fluids but you should avoid alcoholic  beverages for 24 hours.  ACTIVITY:  You should plan to take it easy for the rest of today and you should NOT DRIVE or use heavy machinery until tomorrow (because of the sedation medicines used during the test).    FOLLOW UP: Our staff will call the number listed on your records the next business day following your procedure to check on you and address any questions or concerns that you may have regarding the information given to you following your procedure. If we do not reach you, we will leave a message.  However, if you are feeling well and you are not experiencing any problems, there is no need to return our call.  We will assume that you have returned to your regular daily activities without incident.  If any biopsies were taken you will be contacted by phone or by letter within the next 1-3 weeks.  Please call us at [phone] if you have not heard about the biopsies in 3 weeks.    SIGNATURES/CONFIDENTIALITY: You and/or your care partner have signed paperwork which will be entered into your electronic medical record.  These signatures attest to the fact that that the information above on your After Visit Summary has been reviewed and is understood.  Full responsibility of the confidentiality of this discharge information lies with you and/or your care-partner.

## 2017-12-13 NOTE — Progress Notes (Signed)
Called to room to assist during endoscopic procedure.  Patient ID and intended procedure confirmed with present staff. Received instructions for my participation in the procedure from the performing physician.  

## 2017-12-13 NOTE — Op Note (Signed)
[facility] Patient Name: Tracy Smith Procedure Date: 12/13/2017 11:00 AM MRN: [id number] Endoscopist: Jackquline Denmark , MD Age: 62 Referring MD:  Date of Birth: 1955-10-01 Gender: Male Account #: [id number] Procedure:                Colonoscopy Indications:              Screening for colorectal malignant neoplasm Medicines:                Monitored Anesthesia Care Procedure:                Pre-Anesthesia Assessment:                           - Prior to the procedure, a History and Physical                            was performed, and patient medications and                            allergies were reviewed. The patient's tolerance of                            previous anesthesia was also reviewed. The risks                            and benefits of the procedure and the sedation                            options and risks were discussed with the patient.                            All questions were answered, and informed consent                            was obtained. Prior Anticoagulants: The patient has                            taken no previous anticoagulant or antiplatelet                            agents. ASA Grade Assessment: I - A normal, healthy                            patient. After reviewing the risks and benefits,                            the patient was deemed in satisfactory condition to                            undergo the procedure.                           After obtaining informed consent, the colonoscope  was passed under direct vision. Throughout the                            procedure, the patient's blood pressure, pulse, and                            oxygen saturations were monitored continuously. The                            Colonoscope was introduced through the anus and                            advanced to the the cecum, identified by                            appendiceal orifice and ileocecal valve. The                             colonoscopy was performed without difficulty. The                            patient tolerated the procedure well. The quality                            of the bowel preparation was excellent. Scope In: 11:04:06 AM Scope Out: 11:14:39 AM Scope Withdrawal Time: 0 hours 8 minutes 37 seconds  Total Procedure Duration: 0 hours 10 minutes 33 seconds  Findings:                 A 6 mm polyp was found in the sigmoid colon. The                            polyp was sessile. The polyp was removed with a                            cold snare. Resection and retrieval were complete.                            Estimated blood loss: none.                           The exam was otherwise without abnormality on                            direct and retroflexion views. Complications:            No immediate complications. Estimated Blood Loss:     Estimated blood loss: none. Impression:               - Small colonic polyp status post polypectomy.                           - The examination was otherwise normal on direct  and retroflexion views. Recommendation:           - Patient has a contact number available for                            emergencies. The signs and symptoms of potential                            delayed complications were discussed with the                            patient. Return to normal activities tomorrow.                            Written discharge instructions were provided to the                            patient.                           - Resume previous diet.                           - Continue present medications.                           - Await pathology results.                           - Repeat colonoscopy for surveillance based on                            pathology results. Jackquline Denmark, MD 12/13/2017 11:18:34 AM This report has been signed electronically.

## 2017-12-14 ENCOUNTER — Telehealth: Payer: Self-pay | Admitting: *Deleted

## 2017-12-14 NOTE — Telephone Encounter (Signed)
  Follow up Call-  Call back number 12/13/2017  Post procedure Call Back phone  # [phone]  Permission to leave phone message Yes     Patient questions:  Do you have a fever, pain , or abdominal swelling? No. Pain Score  0 *  Have you tolerated food without any problems? Yes.    Have you been able to return to your normal activities? Yes.    Do you have any questions about your discharge instructions: Diet   No. Medications  No. Follow up visit  No.  Do you have questions or concerns about your Care? No.  Actions: * If pain score is 4 or above: No action needed, pain <4.

## 2017-12-27 ENCOUNTER — Encounter: Payer: Self-pay | Admitting: Gastroenterology

## 2018-09-05 ENCOUNTER — Telehealth: Payer: Self-pay | Admitting: Family Medicine

## 2018-09-05 DIAGNOSIS — L309 Dermatitis, unspecified: Secondary | ICD-10-CM

## 2018-09-05 NOTE — Telephone Encounter (Signed)
Copied from [place] [id number] [phone]. Topic: Quick Communication - Rx Refill/Question >> Sep 05, 2018  2:11 PM [name] A wrote: Medication: triamcinolone cream (KENALOG) 0.1 %  Has the patient contacted their pharmacy? No. Pt is requesting a call when this is sent in to the pharmacy. (Agent: If no, request that the patient contact the pharmacy for the refill.) (Agent: If yes, when and what did the pharmacy advise?)  Preferred Pharmacy (with phone number or street name): [id number] [address], [place] [address] Phone: [phone] Fax: [phone] Not a 24 hour pharmacy; exact hours not known.    Agent: Please be advised that RX refills may take up to 3 business days. We ask that you follow-up with your pharmacy.

## 2018-09-06 ENCOUNTER — Ambulatory Visit (INDEPENDENT_AMBULATORY_CARE_PROVIDER_SITE_OTHER): Payer: Managed Care, Other (non HMO) | Admitting: Family Medicine

## 2018-09-06 ENCOUNTER — Other Ambulatory Visit: Payer: Self-pay

## 2018-09-06 ENCOUNTER — Encounter: Payer: Self-pay | Admitting: Family Medicine

## 2018-09-06 DIAGNOSIS — L309 Dermatitis, unspecified: Secondary | ICD-10-CM

## 2018-09-06 DIAGNOSIS — I10 Essential (primary) hypertension: Secondary | ICD-10-CM

## 2018-09-06 DIAGNOSIS — R140 Abdominal distension (gaseous): Secondary | ICD-10-CM | POA: Diagnosis not present

## 2018-09-06 DIAGNOSIS — E785 Hyperlipidemia, unspecified: Secondary | ICD-10-CM | POA: Diagnosis not present

## 2018-09-06 MED ORDER — TRIAMCINOLONE ACETONIDE 0.1 % EX CREA: 1.0000 "application " | TOPICAL_CREAM | Freq: Two times a day (BID) | CUTANEOUS | 0 refills | Status: DC

## 2018-09-06 NOTE — Patient Instructions (Signed)
The only lifestyle changes that have data behind them are weight loss for the overweight/obese and elevating the head of the bed. Finding out which foods/positions are triggers is important.  Stop chewing gum, drinking carbonated beverages, gulping liquids, and drinking alcohol to help with belching.  These foods may cause you to belch more: Wheat, barley, rye, onion, leek, white part of spring onion, garlic, shallots, artichokes, beetroot, fennel, peas, chicory, pistachio, cashews, legumes, lentils, and chickpeas; Milk, custard, ice cream, and yogurt; Apples, pears, mangoes, cherries, watermelon, asparagus, sugar snap peas, honey, high-fructose corn syrup; Apricots, nectarines, peaches, plums, mushrooms, cauliflower, artificially sweetened chewing gum and confectionery  Pick up the exercise again.  Let us know if you need anything.

## 2018-09-06 NOTE — Progress Notes (Signed)
Chief Complaint  Patient presents with  . Hypertension    Subjective Cincere Zorn is a 63 y.o. male who presents for hypertension follow up. Due to outbreak, we are interacting via web portal for an electronic face-to-face visit. I verified patient's ID using 2 identifiers.  He does not monitor home blood pressures. He is compliant with medications- Lotrel 5-40 mg/d. Patient has these side effects of medication: none He is adhering to a healthy diet overall. Current exercise: walking  Hyperlipidemia Patient presents for dyslipidemia follow up. Currently being treated with Zocor 20 mg/d and compliance with treatment thus far has been good. He denies myalgias. He is adhering to a healthy. Exercise: walking The patient is not known to have coexisting coronary artery disease.  Having rash on buttock after using alcohol wipes. Would like refill of Kenalog cream as that worked well when this happened in past.   Hx of bloating just over umbilicus over past several mo. No pain or belching, denies any bowel changes. Diet has been healthy. Has cut down on exercise due to pandemic.  No unintentional wt loss (has lost around 12 lbs since last visit intentionally), nighttime awakenings. Has not tried anything so far.   Past Medical History:  Diagnosis Date  . Allergy   . Cholecystitis 11/2016  . High cholesterol   . Hypertension     Review of Systems Cardiovascular: no chest pain Respiratory:  no shortness of breath  Exam No conversational dyspnea Age appropriate judgment and insight Nml affect and mood  Hyperlipidemia, unspecified hyperlipidemia type  Essential hypertension  Dermatitis - Plan: triamcinolone cream (KENALOG) 0.1 %  Abdominal bloating  Cont meds for #1 and 2. Counseled on diet and exercise. 3- refill cream 4- increase exercise, eructation diet provided. Elevate head of bed, monitor which foods set him off. Will consider PPI trial for 60 d if no improvement.   F/u in 6 mo for CPE or prn. The patient voiced understanding and agreement to the plan.  [place], DO 09/06/18  3:30 PM

## 2018-09-11 ENCOUNTER — Other Ambulatory Visit: Payer: Self-pay | Admitting: Family Medicine

## 2018-09-11 MED ORDER — FAMOTIDINE 20 MG PO TABS: 20.0000 mg | ORAL_TABLET | Freq: Two times a day (BID) | ORAL | 1 refills | Status: DC

## 2018-09-11 NOTE — Progress Notes (Signed)
Pepcid sent 

## 2018-09-21 ENCOUNTER — Other Ambulatory Visit: Payer: Self-pay | Admitting: Family Medicine

## 2018-11-08 ENCOUNTER — Ambulatory Visit: Payer: Self-pay | Admitting: Psychology

## 2018-12-05 ENCOUNTER — Other Ambulatory Visit: Payer: Self-pay | Admitting: Family Medicine

## 2018-12-13 ENCOUNTER — Telehealth: Payer: Self-pay | Admitting: *Deleted

## 2018-12-13 DIAGNOSIS — Z0000 Encounter for general adult medical examination without abnormal findings: Secondary | ICD-10-CM

## 2018-12-13 NOTE — Telephone Encounter (Signed)
Called to inform/rescheduled physical/scheduled lab appt.

## 2018-12-13 NOTE — Telephone Encounter (Signed)
Copied from [place] [id number]. Topic: Appointment Scheduling - Scheduling Inquiry for Clinic >> Dec 12, 2018 11:58 AM Sheran Luz wrote: Patient would like to come in for routine lab work for medications before appointment in October. Patient is requesting a call back when orders are in.

## 2018-12-13 NOTE — Telephone Encounter (Signed)
Future orders placed. Ty.

## 2018-12-30 ENCOUNTER — Other Ambulatory Visit: Payer: Self-pay

## 2018-12-30 ENCOUNTER — Other Ambulatory Visit (INDEPENDENT_AMBULATORY_CARE_PROVIDER_SITE_OTHER): Payer: Managed Care, Other (non HMO)

## 2018-12-30 DIAGNOSIS — Z0000 Encounter for general adult medical examination without abnormal findings: Secondary | ICD-10-CM | POA: Diagnosis not present

## 2018-12-30 LAB — LIPID PANEL
Cholesterol: 179 mg/dL (ref 0–200)
HDL: 46.3 mg/dL (ref >39.00)
LDL Cholesterol: 104 mg/dL — ABNORMAL HIGH (ref 0–99)
NonHDL: 132.9
Total CHOL/HDL Ratio: 4
Triglycerides: 146 mg/dL (ref 0.0–149.0)
VLDL: 29.2 mg/dL (ref 0.0–40.0)

## 2018-12-30 LAB — COMPREHENSIVE METABOLIC PANEL
ALT: 28 U/L (ref 0–53)
AST: 20 U/L (ref 0–37)
Albumin: 4.7 g/dL (ref 3.5–5.2)
Alkaline Phosphatase: 45 U/L (ref 39–117)
BUN: 20 mg/dL (ref 6–23)
CO2: 27 mEq/L (ref 19–32)
Calcium: 9.4 mg/dL (ref 8.4–10.5)
Chloride: 104 mEq/L (ref 96–112)
Creatinine, Ser: 1.3 mg/dL (ref 0.40–1.50)
GFR: 56.08 mL/min — ABNORMAL LOW (ref >60.00)
Glucose, Bld: 117 mg/dL — ABNORMAL HIGH (ref 70–99)
Potassium: 4.1 mEq/L (ref 3.5–5.1)
Sodium: 141 mEq/L (ref 135–145)
Total Bilirubin: 0.5 mg/dL (ref 0.2–1.2)
Total Protein: 6.9 g/dL (ref 6.0–8.3)

## 2018-12-30 LAB — CBC
HCT: 46 % (ref 39.0–52.0)
Hemoglobin: 15.9 g/dL (ref 13.0–17.0)
MCHC: 34.6 g/dL (ref 30.0–36.0)
MCV: 93.4 fl (ref 78.0–100.0)
Platelets: 202 10*3/uL (ref 150.0–400.0)
RBC: 4.92 Mil/uL (ref 4.22–5.81)
RDW: 13 % (ref 11.5–15.5)
WBC: 8.1 10*3/uL (ref 4.0–10.5)

## 2018-12-31 ENCOUNTER — Other Ambulatory Visit (INDEPENDENT_AMBULATORY_CARE_PROVIDER_SITE_OTHER): Payer: Managed Care, Other (non HMO)

## 2018-12-31 DIAGNOSIS — R739 Hyperglycemia, unspecified: Secondary | ICD-10-CM | POA: Diagnosis not present

## 2018-12-31 LAB — HEMOGLOBIN A1C: Hgb A1c MFr Bld: 5.9 % (ref 4.6–6.5)

## 2019-01-03 ENCOUNTER — Other Ambulatory Visit: Payer: Self-pay

## 2019-01-03 ENCOUNTER — Other Ambulatory Visit: Payer: Self-pay | Admitting: Family Medicine

## 2019-01-03 DIAGNOSIS — E785 Hyperlipidemia, unspecified: Secondary | ICD-10-CM

## 2019-01-06 ENCOUNTER — Encounter: Payer: Self-pay | Admitting: Family Medicine

## 2019-01-06 ENCOUNTER — Ambulatory Visit (INDEPENDENT_AMBULATORY_CARE_PROVIDER_SITE_OTHER): Payer: Managed Care, Other (non HMO) | Admitting: Family Medicine

## 2019-01-06 ENCOUNTER — Other Ambulatory Visit: Payer: Self-pay

## 2019-01-06 VITALS — BP 110/68 | HR 106 | Temp 97.9°F | Ht 68.5 in | Wt 223.5 lb

## 2019-01-06 DIAGNOSIS — Z0000 Encounter for general adult medical examination without abnormal findings: Secondary | ICD-10-CM | POA: Diagnosis not present

## 2019-01-06 MED ORDER — FAMOTIDINE 20 MG PO TABS: 20.0000 mg | ORAL_TABLET | Freq: Two times a day (BID) | ORAL | 2 refills | Status: DC

## 2019-01-06 NOTE — Patient Instructions (Addendum)
Keep the diet clean and stay active.  The new Shingrix vaccine (for shingles) is a 2 shot series. It can make people feel low energy, achy and almost like they have the flu for 48 hours after injection. Please plan accordingly when deciding on when to get this shot. Call our office for a nurse visit appointment to get this. The second shot of the series is less severe regarding the side effects, but it still lasts 48 hours.   I recommend getting the flu shot in mid October. This suggestion would change if the CDC comes out with a different recommendation.   Healthy Eating Plan Many factors influence your heart health, including eating and exercise habits. Heart (coronary) risk increases with abnormal blood fat (lipid) levels. Heart-healthy meal planning includes limiting unhealthy fats, increasing healthy fats, and making other small dietary changes. This includes maintaining a healthy body weight to help keep lipid levels within a normal range.  WHAT IS MY PLAN?  Your health care provider recommends that you:  Drink a glass of water before meals to help with satiety.  Eat slowly.  An alternative to the water is to add Metamucil. This will help with satiety as well. It does contain calories, unlike water.  WHAT TYPES OF FAT SHOULD I CHOOSE?  Choose healthy fats more often. Choose monounsaturated and polyunsaturated fats, such as olive oil and canola oil, flaxseeds, walnuts, almonds, and seeds.  Eat more omega-3 fats. Good choices include salmon, mackerel, sardines, tuna, flaxseed oil, and ground flaxseeds. Aim to eat fish at least two times each week.  Avoid foods with partially hydrogenated oils in them. These contain trans fats. Examples of foods that contain trans fats are stick margarine, some tub margarines, cookies, crackers, and other baked goods. If you are going to avoid a fat, this is the one to avoid!  WHAT GENERAL GUIDELINES DO I NEED TO FOLLOW?  Check food labels carefully to  identify foods with trans fats. Avoid these types of options when possible.  Fill one half of your plate with vegetables and green salads. Eat 4-5 servings of vegetables per day. A serving of vegetables equals 1 cup of raw leafy vegetables,  cup of raw or cooked cut-up vegetables, or  cup of vegetable juice.  Fill one fourth of your plate with whole grains. Look for the word "whole" as the first word in the ingredient list.  Fill one fourth of your plate with lean protein foods.  Eat 4-5 servings of fruit per day. A serving of fruit equals one medium whole fruit,  cup of dried fruit,  cup of fresh, frozen, or canned fruit. Try to avoid fruits in cups/syrups as the sugar content can be high.  Eat more foods that contain soluble fiber. Examples of foods that contain this type of fiber are apples, broccoli, carrots, beans, peas, and barley. Aim to get 20-30 g of fiber per day.  Eat more home-cooked food and less restaurant, buffet, and fast food.  Limit or avoid alcohol.  Limit foods that are high in starch and sugar.  Avoid fried foods when able.  Cook foods by using methods other than frying. Baking, boiling, grilling, and broiling are all great options. Other fat-reducing suggestions include: ? Removing the skin from poultry. ? Removing all visible fats from meats. ? Skimming the fat off of stews, soups, and gravies before serving them. ? Steaming vegetables in water or broth.  Lose weight if you are overweight. Losing just 5-10% of your  initial body weight can help your overall health and prevent diseases such as diabetes and heart disease.  Increase your consumption of nuts, legumes, and seeds to 4-5 servings per week. One serving of dried beans or legumes equals  cup after being cooked, one serving of nuts equals 1 ounces, and one serving of seeds equals  ounce or 1 tablespoon.  WHAT ARE GOOD FOODS CAN I EAT? Grains Grainy breads (try to find bread that is 3 g of fiber per  slice or greater), oatmeal, light popcorn. Whole-grain cereals. Rice and pasta, including brown rice and those that are made with whole wheat. Edamame pasta is a great alternative to grain pasta. It has a higher protein content. Try to avoid significant consumption of white bread, sugary cereals, or pastries/baked goods.  Vegetables All vegetables. Cooked white potatoes do not count as vegetables.  Fruits All fruits, but limit pineapple and bananas as these fruits have a higher sugar content.  Meats and Other Protein Sources Lean, well-trimmed beef, veal, pork, and lamb. Chicken and [place] without skin. All fish and shellfish. Wild duck, rabbit, pheasant, and venison. Egg whites or low-cholesterol egg substitutes. Dried beans, peas, lentils, and tofu.Seeds and most nuts.  Dairy Low-fat or nonfat cheeses, including ricotta, string, and mozzarella. Skim or 1% milk that is liquid, powdered, or evaporated. Buttermilk that is made with low-fat milk. Nonfat or low-fat yogurt. Soy/Almond milk are good alternatives if you cannot handle dairy.  Beverages Water is the best for you. Sports drinks with less sugar are more desirable unless you are a highly active athlete.  Sweets and Desserts Sherbets and fruit ices. Honey, jam, marmalade, jelly, and syrups. Dark chocolate.  Eat all sweets and desserts in moderation.  Fats and Oils Nonhydrogenated (trans-free) margarines. Vegetable oils, including soybean, sesame, sunflower, olive, peanut, safflower, corn, canola, and cottonseed. Salad dressings or mayonnaise that are made with a vegetable oil. Limit added fats and oils that you use for cooking, baking, salads, and as spreads.  Other Cocoa powder. Coffee and tea. Most condiments.  The items listed above may not be a complete list of recommended foods or beverages. Contact your dietitian for more options.

## 2019-01-06 NOTE — Progress Notes (Signed)
Chief Complaint  Patient presents with  . Annual Exam    Well Male Tracy Smith is here for a complete physical.   His last physical was >1 year ago.  Current diet: in general, a "healthy" diet.  Current exercise: hitting bag, walking Weight trend: stable Daytime fatigue? No. Seat belt? Yes.    Health maintenance Shingrix- No Colonoscopy- Yes Tetanus- Yes HIV- Yes Hep C- Yes   Past Medical History:  Diagnosis Date  . Allergy   . Cholecystitis 11/2016  . High cholesterol   . Hypertension       Past Surgical History:  Procedure Laterality Date  . CHOLECYSTECTOMY N/A 11/21/2016   Procedure: LAPAROSCOPIC CHOLECYSTECTOMY;  Surgeon: Georganna Skeans, MD;  Location: [facility];  Service: General;  Laterality: N/A;  . CHOLECYSTECTOMY     2018  . CHOLECYSTECTOMY  11/20/2016  . COLONOSCOPY    . COLONOSCOPY      Medications  Current Outpatient Medications on File Prior to Visit  Medication Sig Dispense Refill  . amLODipine-benazepril (LOTREL) 5-40 MG capsule TAKE 1 CAPSULE BY MOUTH  EVERY EVENING 90 capsule 3  . aspirin EC 81 MG tablet Take 81 mg by mouth every evening.     . cetirizine (ZYRTEC) 10 MG tablet Take 10 mg by mouth daily.    . montelukast (SINGULAIR) 10 MG tablet TAKE 1 TABLET BY MOUTH  EVERY EVENING 90 tablet 3  . simvastatin (ZOCOR) 20 MG tablet TAKE 1 TABLET BY MOUTH  EVERY EVENING 90 tablet 3  . triamcinolone cream (KENALOG) 0.1 % Apply 1 application topically 2 (two) times daily. 30 g 0   Allergies Allergies  Allergen Reactions  . Bee Venom     swelling  . No Known Allergies     Family History Family History  Problem Relation Age of Onset  . High blood pressure Father   . Heart disease Father   . Colon cancer Neg Hx   . Colon polyps Neg Hx   . Esophageal cancer Neg Hx   . Rectal cancer Neg Hx   . Stomach cancer Neg Hx     Review of Systems: Constitutional:  no fevers Eye:  no recent significant change in vision Ear/Nose/Mouth/Throat:  Ears:   no hearing loss Nose/Mouth/Throat:  no complaints of nasal congestion, no sore throat Cardiovascular:  no chest pain, no palpitations Respiratory:  no cough and no shortness of breath Gastrointestinal:  no abdominal pain, no change in bowel habits GU:  Male: negative for dysuria, frequency, and incontinence and negative for prostate symptoms Musculoskeletal/Extremities:  no pain, redness, or swelling of the joints Integumentary (Skin/Breast):  no abnormal skin lesions reported Neurologic:  no headaches Endocrine: No unexpected weight changes Hematologic/Lymphatic:  no abnormal bleeding  Exam BP 110/68 (BP Location: Left Arm, Patient Position: Sitting, Cuff Size: Normal)   Pulse (!) 106   Temp 97.9 F (36.6 C) (Oral)   Ht 5' 8.5" (1.74 m)   Wt 223 lb 8 oz (101.4 kg)   SpO2 97%   BMI 33.49 kg/m  General:  well developed, well nourished, in no apparent distress Skin:  no significant moles, warts, or growths Head:  no masses, lesions, or tenderness Eyes:  pupils equal and round, sclera anicteric without injection Ears:  canals without lesions, TMs shiny without retraction, no obvious effusion, no erythema Nose:  nares patent, septum midline, mucosa normal Throat/Pharynx:  lips and gingiva without lesion; tongue and uvula midline; non-inflamed pharynx; no exudates or postnasal drainage Neck: neck supple  without adenopathy, thyromegaly, or masses Lungs:  clear to auscultation, breath sounds equal bilaterally, no respiratory distress Cardio:  regular rate and rhythm (HR around 72), no LE edema, no bruits Abdomen:  abdomen soft, nontender; bowel sounds normal; no masses or organomegaly Rectal: Deferred Musculoskeletal:  symmetrical muscle groups noted without atrophy or deformity Extremities:  no clubbing, cyanosis, or edema, no deformities, no skin discoloration Neuro:  gait normal; deep tendon reflexes normal and symmetric Psych: well oriented with normal range of affect and  appropriate judgment/insight  Assessment and Plan  Well adult exam   Well 63 y.o. male. Counseled on diet and exercise. Immunizations, labs, and further orders as above. Shingrix info give. Flu shot rec'd for mid Oct. Follow up in 6 mo or prn. The patient voiced understanding and agreement to the plan.  [place], DO 01/06/19 3:01 PM

## 2019-01-13 ENCOUNTER — Telehealth: Payer: Self-pay | Admitting: Family Medicine

## 2019-01-13 MED ORDER — FAMOTIDINE 20 MG PO TABS: 20.0000 mg | ORAL_TABLET | Freq: Two times a day (BID) | ORAL | 2 refills | Status: DC

## 2019-01-13 NOTE — Telephone Encounter (Signed)
famotidine (PEPCID) 20 MG tablet was sent to [facility] which was wrong pharmacy. Pt's wife stated the medication should have been sent to [facility].The prescription was forward from [facility] and they will get the 30 day supply because pt is out of  meds. Pt need a 90 day supply sent to [facility] for future refills please advise

## 2019-01-13 NOTE — Telephone Encounter (Signed)
Sent in

## 2019-03-07 ENCOUNTER — Other Ambulatory Visit: Payer: Self-pay

## 2019-03-07 ENCOUNTER — Ambulatory Visit (INDEPENDENT_AMBULATORY_CARE_PROVIDER_SITE_OTHER): Payer: Managed Care, Other (non HMO)

## 2019-03-07 DIAGNOSIS — Z23 Encounter for immunization: Secondary | ICD-10-CM | POA: Diagnosis not present

## 2019-03-10 ENCOUNTER — Ambulatory Visit: Payer: Managed Care, Other (non HMO) | Admitting: Family Medicine

## 2019-07-24 ENCOUNTER — Ambulatory Visit: Payer: Managed Care, Other (non HMO) | Attending: Internal Medicine

## 2019-07-24 DIAGNOSIS — Z23 Encounter for immunization: Secondary | ICD-10-CM

## 2019-07-24 NOTE — Progress Notes (Signed)
   Covid-19 Vaccination Clinic  Name:  Tracy Smith    MRN: [id number] DOB: [birth year]  07/24/2019  [name] was observed post Covid-19 immunization for 15 minutes without incident. He was provided with Vaccine Information Sheet and instruction to access the V-Safe system.   [name] was instructed to call 911 with any severe reactions post vaccine: Marland Kitchen Difficulty breathing  . Swelling of face and throat  . A fast heartbeat  . A bad rash all over body  . Dizziness and weakness   Immunizations Administered    Name Date Dose VIS Date Route   Pfizer COVID-19 Vaccine 07/24/2019  1:35 PM 0.3 mL 04/25/2019 Intramuscular   Manufacturer: [place]   Lot: [id number]   [place]: [id number]

## 2019-08-18 ENCOUNTER — Ambulatory Visit: Payer: Managed Care, Other (non HMO) | Attending: Internal Medicine

## 2019-08-18 DIAGNOSIS — Z23 Encounter for immunization: Secondary | ICD-10-CM

## 2019-08-18 NOTE — Progress Notes (Signed)
   Covid-19 Vaccination Clinic  Name:  Tracy Smith    MRN: [id number] DOB: 1955/08/14  08/18/2019  [name] was observed post Covid-19 immunization for 15 minutes without incident. He was provided with Vaccine Information Sheet and instruction to access the V-Safe system.   [name] was instructed to call 911 with any severe reactions post vaccine: Marland Kitchen Difficulty breathing  . Swelling of face and throat  . A fast heartbeat  . A bad rash all over body  . Dizziness and weakness   Immunizations Administered    Name Date Dose VIS Date Route   Pfizer COVID-19 Vaccine 08/18/2019 10:16 AM 0.3 mL 04/25/2019 Intramuscular   Manufacturer: [place]   Lot: [id number]   [place]: [id number]

## 2019-08-25 ENCOUNTER — Other Ambulatory Visit: Payer: Self-pay

## 2019-08-26 ENCOUNTER — Other Ambulatory Visit: Payer: Self-pay

## 2019-08-26 ENCOUNTER — Ambulatory Visit (INDEPENDENT_AMBULATORY_CARE_PROVIDER_SITE_OTHER): Payer: Managed Care, Other (non HMO) | Admitting: Family Medicine

## 2019-08-26 ENCOUNTER — Encounter: Payer: Self-pay | Admitting: Family Medicine

## 2019-08-26 VITALS — BP 118/82 | HR 73 | Temp 96.6°F | Ht 68.5 in | Wt 227.0 lb

## 2019-08-26 DIAGNOSIS — Z125 Encounter for screening for malignant neoplasm of prostate: Secondary | ICD-10-CM

## 2019-08-26 DIAGNOSIS — Z0000 Encounter for general adult medical examination without abnormal findings: Secondary | ICD-10-CM

## 2019-08-26 LAB — COMPREHENSIVE METABOLIC PANEL
ALT: 31 U/L (ref 0–53)
AST: 24 U/L (ref 0–37)
Albumin: 4.7 g/dL (ref 3.5–5.2)
Alkaline Phosphatase: 53 U/L (ref 39–117)
BUN: 21 mg/dL (ref 6–23)
CO2: 29 mEq/L (ref 19–32)
Calcium: 9.4 mg/dL (ref 8.4–10.5)
Chloride: 100 mEq/L (ref 96–112)
Creatinine, Ser: 1.12 mg/dL (ref 0.40–1.50)
GFR: 66.46 mL/min (ref >60.00)
Glucose, Bld: 94 mg/dL (ref 70–99)
Potassium: 4.9 mEq/L (ref 3.5–5.1)
Sodium: 136 mEq/L (ref 135–145)
Total Bilirubin: 0.8 mg/dL (ref 0.2–1.2)
Total Protein: 7 g/dL (ref 6.0–8.3)

## 2019-08-26 LAB — CBC
HCT: 47.7 % (ref 39.0–52.0)
Hemoglobin: 16.5 g/dL (ref 13.0–17.0)
MCHC: 34.6 g/dL (ref 30.0–36.0)
MCV: 93.6 fl (ref 78.0–100.0)
Platelets: 228 10*3/uL (ref 150.0–400.0)
RBC: 5.1 Mil/uL (ref 4.22–5.81)
RDW: 13.4 % (ref 11.5–15.5)
WBC: 9.3 10*3/uL (ref 4.0–10.5)

## 2019-08-26 LAB — LIPID PANEL
Cholesterol: 162 mg/dL (ref 0–200)
HDL: 41.1 mg/dL (ref >39.00)
LDL Cholesterol: 101 mg/dL — ABNORMAL HIGH (ref 0–99)
NonHDL: 121.27
Total CHOL/HDL Ratio: 4
Triglycerides: 101 mg/dL (ref 0.0–149.0)
VLDL: 20.2 mg/dL (ref 0.0–40.0)

## 2019-08-26 LAB — HEMOGLOBIN A1C: Hgb A1c MFr Bld: 5.7 % (ref 4.6–6.5)

## 2019-08-26 LAB — PSA: PSA: 0.69 ng/mL (ref 0.10–4.00)

## 2019-08-26 NOTE — Patient Instructions (Addendum)
Give [place] 2-3 business days to get the results of your labs back.   Keep the diet clean and stay active.  The new Shingrix vaccine (for shingles) is a 2 shot series. It can make people feel low energy, achy and almost like they have the flu for 48 hours after injection. Please plan accordingly when deciding on when to get this shot. Call our office for a nurse visit appointment to get this. The second shot of the series is less severe regarding the side effects, but it still lasts 48 hours.   Consider a probiotic. I would try Gas-X or BeanO to help with the gas production. If no improvement over next few weeks and bothersome, we could set you up with a specialist.   Let us know if you need anything.

## 2019-08-26 NOTE — Progress Notes (Signed)
Chief Complaint  Patient presents with  . Annual Exam    Well Male Tracy Smith is here for a complete physical.   His last physical was >1 year ago.  Current diet: in general, a "healthy" diet.  Current exercise: walking Weight trend: stable Daytime fatigue? No. Seat belt? Yes.    Health maintenance Shingrix- No Colonoscopy- Yes Tetanus- Yes HIV- Yes Hep C- Yes   Past Medical History:  Diagnosis Date  . Allergy   . Cholecystitis 11/2016  . High cholesterol   . Hypertension       Past Surgical History:  Procedure Laterality Date  . CHOLECYSTECTOMY N/A 11/21/2016   Procedure: LAPAROSCOPIC CHOLECYSTECTOMY;  Surgeon: Georganna Skeans, MD;  Location: [facility];  Service: General;  Laterality: N/A;  . CHOLECYSTECTOMY     2018  . CHOLECYSTECTOMY  11/20/2016  . COLONOSCOPY    . COLONOSCOPY      Medications  Current Outpatient Medications on File Prior to Visit  Medication Sig Dispense Refill  . amLODipine-benazepril (LOTREL) 5-40 MG capsule TAKE 1 CAPSULE BY MOUTH  EVERY EVENING 90 capsule 3  . aspirin EC 81 MG tablet Take 81 mg by mouth every evening.     . cetirizine (ZYRTEC) 10 MG tablet Take 10 mg by mouth daily.    . famotidine (PEPCID) 20 MG tablet Take 1 tablet (20 mg total) by mouth 2 (two) times daily. 180 tablet 2  . montelukast (SINGULAIR) 10 MG tablet TAKE 1 TABLET BY MOUTH  EVERY EVENING 90 tablet 3  . simvastatin (ZOCOR) 20 MG tablet TAKE 1 TABLET BY MOUTH  EVERY EVENING 90 tablet 3  . triamcinolone cream (KENALOG) 0.1 % Apply 1 application topically 2 (two) times daily. 30 g 0   Allergies Allergies  Allergen Reactions  . Bee Venom     swelling  . No Known Allergies     Family History Family History  Problem Relation Age of Onset  . High blood pressure Father   . Heart disease Father   . Colon cancer Neg Hx   . Colon polyps Neg Hx   . Esophageal cancer Neg Hx   . Rectal cancer Neg Hx   . Stomach cancer Neg Hx     Review of  Systems: Constitutional:  no fevers Eye:  no recent significant change in vision Ear/Nose/Mouth/Throat:  Ears:  no hearing loss Nose/Mouth/Throat:  no complaints of nasal congestion, no sore throat Cardiovascular:  no chest pain, no palpitations Respiratory:  no cough and no shortness of breath Gastrointestinal:  no abdominal pain, no change in bowel habits; +fullness after meals GU:  Male: negative for dysuria, frequency, and incontinence and negative for prostate symptoms Musculoskeletal/Extremities:  no pain, redness, or swelling of the joints Integumentary (Skin/Breast):  no abnormal skin lesions reported Neurologic:  no headaches Endocrine: No unexpected weight changes Hematologic/Lymphatic:  no abnormal bleeding  Exam BP 118/82 (BP Location: Left Arm, Patient Position: Sitting, Cuff Size: Normal)   Pulse 73   Temp (!) 96.6 F (35.9 C) (Temporal)   Ht 5' 8.5" (1.74 m)   Wt 227 lb (103 kg)   SpO2 97%   BMI 34.01 kg/m  General:  well developed, well nourished, in no apparent distress Skin:  no significant moles, warts, or growths Head:  no masses, lesions, or tenderness Eyes:  pupils equal and round, sclera anicteric without injection Ears:  canals without lesions, TMs shiny without retraction, no obvious effusion, no erythema Nose:  nares patent, septum midline, mucosa normal  Throat/Pharynx:  lips and gingiva without lesion; tongue and uvula midline; non-inflamed pharynx; no exudates or postnasal drainage Neck: neck supple without adenopathy, thyromegaly, or masses Cardiac: RRR, no bruits, no LE edema Lungs:  clear to auscultation, breath sounds equal bilaterally, no respiratory distress Rectal: Deferred Musculoskeletal:  symmetrical muscle groups noted without atrophy or deformity Neuro:  gait normal; deep tendon reflexes normal and symmetric Psych: well oriented with normal range of affect and appropriate judgment/insight  Assessment and Plan  Well adult exam - Plan:  Hemoglobin A1c, Lipid panel, CBC, Comprehensive metabolic panel  Screening for prostate cancer - Plan: PSA   Well 64 y.o. male. Counseled on diet and exercise. Counseled on risks and benefits of prostate cancer screening with PSA. The patient agrees to undergo testing. We will continue to test given his family hx.  Immunizations, labs, and further orders as above. Follow up in 6 mo. The patient voiced understanding and agreement to the plan.  [place], DO 08/26/19 11:05 AM

## 2019-09-28 ENCOUNTER — Other Ambulatory Visit: Payer: Self-pay | Admitting: Family Medicine

## 2019-11-16 ENCOUNTER — Other Ambulatory Visit: Payer: Self-pay | Admitting: Family Medicine

## 2019-11-16 DIAGNOSIS — E785 Hyperlipidemia, unspecified: Secondary | ICD-10-CM

## 2020-03-07 ENCOUNTER — Other Ambulatory Visit: Payer: Self-pay | Admitting: Family Medicine

## 2020-07-08 ENCOUNTER — Telehealth: Payer: Self-pay | Admitting: Family Medicine

## 2020-07-08 DIAGNOSIS — E785 Hyperlipidemia, unspecified: Secondary | ICD-10-CM

## 2020-07-08 MED ORDER — SIMVASTATIN 20 MG PO TABS: 20.0000 mg | ORAL_TABLET | Freq: Every evening | ORAL | 3 refills | Status: DC

## 2020-07-08 MED ORDER — MONTELUKAST SODIUM 10 MG PO TABS
10.0000 mg | ORAL_TABLET | Freq: Every evening | ORAL | 3 refills | Status: DC
Start: 2020-07-08 — End: 2021-08-08

## 2020-07-08 MED ORDER — AMLODIPINE BESY-BENAZEPRIL HCL 5-40 MG PO CAPS
ORAL_CAPSULE | ORAL | 3 refills | Status: DC
Start: 2020-07-08 — End: 2021-07-19

## 2020-07-08 MED ORDER — FAMOTIDINE 20 MG PO TABS
20.0000 mg | ORAL_TABLET | Freq: Two times a day (BID) | ORAL | 3 refills | Status: DC
Start: 2020-07-08 — End: 2021-09-08

## 2020-07-08 NOTE — Telephone Encounter (Signed)
Rx sent 

## 2020-07-08 NOTE — Telephone Encounter (Signed)
Patient is changing pharmacies and [facility] [address]  Please send these medications in   amLODipine-benazepril (LOTREL) 5-40 MG capsule [[id number]]   famotidine (PEPCID) 20 MG tablet [[id number]]   montelukast (SINGULAIR) 10 MG tablet [[id number]]  simvastatin (ZOCOR) 20 MG tablet [[id number]]

## 2020-09-01 ENCOUNTER — Ambulatory Visit (INDEPENDENT_AMBULATORY_CARE_PROVIDER_SITE_OTHER): Payer: Commercial Managed Care - PPO | Admitting: Family Medicine

## 2020-09-01 ENCOUNTER — Other Ambulatory Visit: Payer: Self-pay

## 2020-09-01 ENCOUNTER — Encounter: Payer: Self-pay | Admitting: Family Medicine

## 2020-09-01 ENCOUNTER — Other Ambulatory Visit (INDEPENDENT_AMBULATORY_CARE_PROVIDER_SITE_OTHER): Payer: Commercial Managed Care - PPO

## 2020-09-01 VITALS — BP 122/80 | HR 85 | Temp 98.5°F | Ht 68.5 in | Wt 230.0 lb

## 2020-09-01 DIAGNOSIS — Z0000 Encounter for general adult medical examination without abnormal findings: Secondary | ICD-10-CM

## 2020-09-01 DIAGNOSIS — Z125 Encounter for screening for malignant neoplasm of prostate: Secondary | ICD-10-CM

## 2020-09-01 DIAGNOSIS — R739 Hyperglycemia, unspecified: Secondary | ICD-10-CM | POA: Diagnosis not present

## 2020-09-01 LAB — LIPID PANEL
Cholesterol: 162 mg/dL (ref 0–200)
HDL: 40.1 mg/dL (ref >39.00)
LDL Cholesterol: 105 mg/dL — ABNORMAL HIGH (ref 0–99)
NonHDL: 122.05
Total CHOL/HDL Ratio: 4
Triglycerides: 87 mg/dL (ref 0.0–149.0)
VLDL: 17.4 mg/dL (ref 0.0–40.0)

## 2020-09-01 LAB — COMPREHENSIVE METABOLIC PANEL
ALT: 21 U/L (ref 0–53)
AST: 18 U/L (ref 0–37)
Albumin: 4.3 g/dL (ref 3.5–5.2)
Alkaline Phosphatase: 53 U/L (ref 39–117)
BUN: 29 mg/dL — ABNORMAL HIGH (ref 6–23)
CO2: 29 mEq/L (ref 19–32)
Calcium: 9.4 mg/dL (ref 8.4–10.5)
Chloride: 102 mEq/L (ref 96–112)
Creatinine, Ser: 1.24 mg/dL (ref 0.40–1.50)
GFR: 62.15 mL/min (ref >60.00)
Glucose, Bld: 111 mg/dL — ABNORMAL HIGH (ref 70–99)
Potassium: 4.7 mEq/L (ref 3.5–5.1)
Sodium: 139 mEq/L (ref 135–145)
Total Bilirubin: 0.5 mg/dL (ref 0.2–1.2)
Total Protein: 6.9 g/dL (ref 6.0–8.3)

## 2020-09-01 LAB — CBC
HCT: 45.8 % (ref 39.0–52.0)
Hemoglobin: 15.7 g/dL (ref 13.0–17.0)
MCHC: 34.2 g/dL (ref 30.0–36.0)
MCV: 93.4 fl (ref 78.0–100.0)
Platelets: 215 10*3/uL (ref 150.0–400.0)
RBC: 4.91 Mil/uL (ref 4.22–5.81)
RDW: 13 % (ref 11.5–15.5)
WBC: 7.8 10*3/uL (ref 4.0–10.5)

## 2020-09-01 LAB — HEMOGLOBIN A1C: Hgb A1c MFr Bld: 6 % (ref 4.6–6.5)

## 2020-09-01 LAB — PSA: PSA: 0.86 ng/mL (ref 0.10–4.00)

## 2020-09-01 MED ORDER — PANTOPRAZOLE SODIUM 40 MG PO TBEC: 40.0000 mg | DELAYED_RELEASE_TABLET | Freq: Every day | ORAL | 1 refills | Status: DC

## 2020-09-01 NOTE — Patient Instructions (Addendum)
Give [place] 2-3 business days to get the results of your labs back.   Keep the diet clean and stay active.  The new Shingrix vaccine (for shingles) is a 2 shot series. It can make people feel low energy, achy and almost like they have the flu for 48 hours after injection. Please plan accordingly when deciding on when to get this shot. Call our office for a nurse visit appointment to get this. The second shot of the series is less severe regarding the side effects, but it still lasts 48 hours.   The only lifestyle changes that have data behind them are weight loss for the overweight/obese and elevating the head of the bed. Finding out which foods/positions are triggers is important. If we aren't improving in the next few weeks on the medicine, let me know. If you want to see a specialist, let me know as well.   Let us know if you need anything.

## 2020-09-01 NOTE — Addendum Note (Signed)
Addended by: Octavio Manns E on: 09/01/2020 03:29 PM   Modules accepted: Orders

## 2020-09-01 NOTE — Progress Notes (Signed)
Chief Complaint  Patient presents with  . Annual Exam    Well Male Tracy Smith is here for a complete physical.   His last physical was >1 year ago.  Current diet: in general, a "healthy" diet.  Current exercise: walking, wt resistance exercise Weight trend: increased a little Fatigue out of ordinary? No. Seat belt? Yes.    Health maintenance Shingrix- No Colonoscopy- Yes Tetanus- Yes HIV- Yes Hep C- Yes   Past Medical History:  Diagnosis Date  . Allergy   . High cholesterol   . Hypertension       Past Surgical History:  Procedure Laterality Date  . CHOLECYSTECTOMY N/A 11/21/2016   Procedure: LAPAROSCOPIC CHOLECYSTECTOMY;  Surgeon: Georganna Skeans, MD;  Location: [facility];  Service: General;  Laterality: N/A;  . COLONOSCOPY     Medications  Current Outpatient Medications on File Prior to Visit  Medication Sig Dispense Refill  . amLODipine-benazepril (LOTREL) 5-40 MG capsule TAKE 1 CAPSULE BY MOUTH IN  THE EVENING 90 capsule 3  . aspirin EC 81 MG tablet Take 81 mg by mouth every evening.     . cetirizine (ZYRTEC) 10 MG tablet Take 10 mg by mouth daily.    . famotidine (PEPCID) 20 MG tablet Take 1 tablet (20 mg total) by mouth 2 (two) times daily. 180 tablet 3  . montelukast (SINGULAIR) 10 MG tablet Take 1 tablet (10 mg total) by mouth every evening. 90 tablet 3  . simvastatin (ZOCOR) 20 MG tablet Take 1 tablet (20 mg total) by mouth every evening. 90 tablet 3  . triamcinolone cream (KENALOG) 0.1 % Apply 1 application topically 2 (two) times daily. 30 g 0   Allergies Allergies  Allergen Reactions  . Bee Venom     swelling  . No Known Allergies     Family History Family History  Problem Relation Age of Onset  . High blood pressure Father   . Heart disease Father   . Colon cancer Neg Hx   . Colon polyps Neg Hx   . Esophageal cancer Neg Hx   . Rectal cancer Neg Hx   . Stomach cancer Neg Hx     Review of Systems: Constitutional:  no fevers Eye:  no recent  significant change in vision Ear/Nose/Mouth/Throat:  Ears:  no hearing loss Nose/Mouth/Throat:  no complaints of nasal congestion, no sore throat Cardiovascular:  no chest pain Respiratory:  no shortness of breath Gastrointestinal:  no change in bowel habits, +abd bloating GU:  Male: negative for dysuria, frequency Musculoskeletal/Extremities:  no joint pain Integumentary (Skin/Breast):  no abnormal skin lesions reported Neurologic:  no headaches Endocrine: No unexpected weight changes Hematologic/Lymphatic:  no abnormal bleeding  Exam BP 122/80 (BP Location: Left Arm, Patient Position: Sitting, Cuff Size: Normal)   Pulse 85   Temp 98.5 F (36.9 C) (Oral)   Ht 5' 8.5" (1.74 m)   Wt 230 lb (104.3 kg)   SpO2 98%   BMI 34.46 kg/m  General:  well developed, well nourished, in no apparent distress Skin:  no significant moles, warts, or growths Head:  no masses, lesions, or tenderness Eyes:  pupils equal and round, sclera anicteric without injection Ears:  canals without lesions, TMs shiny without retraction, no obvious effusion, no erythema Nose:  nares patent, septum midline, mucosa normal Throat/Pharynx:  lips and gingiva without lesion; tongue and uvula midline; non-inflamed pharynx; no exudates or postnasal drainage Neck: neck supple without adenopathy, thyromegaly, or masses Abd: BS+, mild distension, no TTP, no  masses or organomegaly Cardiac: RRR, no bruits, no LE edema Lungs:  clear to auscultation, breath sounds equal bilaterally, no respiratory distress Rectal: Deferred Musculoskeletal:  symmetrical muscle groups noted without atrophy or deformity Neuro:  gait normal; deep tendon reflexes normal and symmetric Psych: well oriented with normal range of affect and appropriate judgment/insight  Assessment and Plan  Well adult exam - Plan: CBC, Comprehensive metabolic panel, Lipid panel  Screening for prostate cancer - Plan: PSA   Well 65 y.o. male. Counseled on diet and  exercise. Counseled on risks and benefits of prostate cancer screening with PSA. The patient agrees to undergo testing. Immunizations, labs, and further orders as above. For bloating, he will try prn Pepcid and improving diet, exercising more. Consider Protonix vs Gi referral.  Follow up in 6 mo or prn. The patient voiced understanding and agreement to the plan.  [place], DO 09/01/20 10:48 AM

## 2021-03-04 ENCOUNTER — Ambulatory Visit: Payer: Commercial Managed Care - PPO | Admitting: Family Medicine

## 2021-03-18 ENCOUNTER — Ambulatory Visit: Payer: Self-pay | Admitting: Family Medicine

## 2021-03-25 ENCOUNTER — Other Ambulatory Visit: Payer: Self-pay

## 2021-03-25 ENCOUNTER — Encounter: Payer: Self-pay | Admitting: Family Medicine

## 2021-03-25 ENCOUNTER — Ambulatory Visit: Payer: BC Managed Care – PPO | Admitting: Family Medicine

## 2021-03-25 VITALS — BP 122/80 | HR 89 | Temp 98.7°F | Ht 68.5 in | Wt 228.0 lb

## 2021-03-25 DIAGNOSIS — I10 Essential (primary) hypertension: Secondary | ICD-10-CM | POA: Diagnosis not present

## 2021-03-25 DIAGNOSIS — E785 Hyperlipidemia, unspecified: Secondary | ICD-10-CM | POA: Diagnosis not present

## 2021-03-25 LAB — COMPREHENSIVE METABOLIC PANEL
ALT: 23 U/L (ref 0–53)
AST: 18 U/L (ref 0–37)
Albumin: 4.6 g/dL (ref 3.5–5.2)
Alkaline Phosphatase: 49 U/L (ref 39–117)
BUN: 20 mg/dL (ref 6–23)
CO2: 29 mEq/L (ref 19–32)
Calcium: 9.2 mg/dL (ref 8.4–10.5)
Chloride: 102 mEq/L (ref 96–112)
Creatinine, Ser: 1.16 mg/dL (ref 0.40–1.50)
GFR: 67.06 mL/min (ref >60.00)
Glucose, Bld: 103 mg/dL — ABNORMAL HIGH (ref 70–99)
Potassium: 4.3 mEq/L (ref 3.5–5.1)
Sodium: 138 mEq/L (ref 135–145)
Total Bilirubin: 0.7 mg/dL (ref 0.2–1.2)
Total Protein: 6.9 g/dL (ref 6.0–8.3)

## 2021-03-25 LAB — LIPID PANEL
Cholesterol: 174 mg/dL (ref 0–200)
HDL: 42.1 mg/dL (ref >39.00)
LDL Cholesterol: 108 mg/dL — ABNORMAL HIGH (ref 0–99)
NonHDL: 131.64
Total CHOL/HDL Ratio: 4
Triglycerides: 118 mg/dL (ref 0.0–149.0)
VLDL: 23.6 mg/dL (ref 0.0–40.0)

## 2021-03-25 NOTE — Patient Instructions (Addendum)
Give [place] 2-3 business days to get the results of your labs back.   Keep the diet clean and stay active.  The new Shingrix vaccine (for shingles) is a 2 shot series. It can make people feel low energy, achy and almost like they have the flu for 48 hours after injection. Please plan accordingly when deciding on when to get this shot. Call our office for a nurse visit appointment to get this. The second shot of the series is less severe regarding the side effects, but it still lasts 48 hours.   I recommend getting the updated bivalent covid vaccination booster at your convenience.   Try IBGard (concentrated peppermint) for a few weeks and see if it helps your bloating. This is over the counter.   Let us know if you need anything.

## 2021-03-25 NOTE — Progress Notes (Signed)
Chief Complaint  Patient presents with   Follow-up    Subjective Tracy Smith is a 65 y.o. male who presents for hypertension follow up. He does not monitor home blood pressures. He is compliant with medications.- Lotrel 5-40 mg/d Patient has these side effects of medication: none He is adhering to a healthy diet overall. Current exercise: walking, wt resistance exercise No CP or SOB.  Hyperlipidemia Patient presents for hyperlipidemia follow up. Currently being treated with Zocor 20 mg/d and compliance with treatment thus far has been good. He denies myalgias. Diet/exercise as above.  The patient is not known to have coexisting coronary artery disease.   Past Medical History:  Diagnosis Date   Allergy    High cholesterol    Hypertension     Exam BP 122/80   Pulse 89   Temp 98.7 F (37.1 C) (Oral)   Ht 5' 8.5" (1.74 m)   Wt 228 lb (103.4 kg)   SpO2 98%   BMI 34.16 kg/m  General:  well developed, well nourished, in no apparent distress Heart: RRR, no bruits, no LE edema Lungs: clear to auscultation, no accessory muscle use Psych: well oriented with normal range of affect and appropriate judgment/insight  Essential hypertension  Hyperlipidemia, unspecified hyperlipidemia type - Plan: Lipid panel, Comprehensive metabolic panel  Chronic, stable. Cont Lotrel 5-40 mg/d. Counseled on diet and exercise. Chronic, stable. Cont Zocor 20 mg/d. Ck labs.  F/u in 6 mo. The patient voiced understanding and agreement to the plan.  [place], DO 03/25/21  8:39 AM

## 2021-06-16 ENCOUNTER — Telehealth: Payer: Self-pay

## 2021-06-16 NOTE — Telephone Encounter (Signed)
Spoke with patient's Spouse, Tamela Oddi, this week and she stated patient paid $35 for his co-pay on DOS 03/25/21.  That is what his specialist co-pay is and his PCP co-pay is $20.  I have sent an email to the Patient Accounting Manager and Charge Correction team asking them to please contact Doris regarding this billing issue.  Tamela Oddi is aware this has been forwarded to them for review.

## 2021-06-29 NOTE — Telephone Encounter (Signed)
Pt's wife requested to speak with Anderson Malta for an update on the billing issue. [phone]

## 2021-07-19 ENCOUNTER — Other Ambulatory Visit: Payer: Self-pay | Admitting: Family Medicine

## 2021-08-07 ENCOUNTER — Other Ambulatory Visit: Payer: Self-pay | Admitting: Family Medicine

## 2021-08-19 ENCOUNTER — Other Ambulatory Visit: Payer: Self-pay | Admitting: Family Medicine

## 2021-08-19 DIAGNOSIS — E785 Hyperlipidemia, unspecified: Secondary | ICD-10-CM

## 2021-08-22 ENCOUNTER — Ambulatory Visit: Payer: BC Managed Care – PPO | Admitting: Family Medicine

## 2021-08-23 ENCOUNTER — Encounter: Payer: Self-pay | Admitting: Family Medicine

## 2021-08-23 ENCOUNTER — Ambulatory Visit (INDEPENDENT_AMBULATORY_CARE_PROVIDER_SITE_OTHER): Payer: BC Managed Care – PPO | Admitting: Family Medicine

## 2021-08-23 VITALS — BP 120/80 | HR 80 | Temp 97.9°F | Ht 68.5 in | Wt 227.0 lb

## 2021-08-23 DIAGNOSIS — E785 Hyperlipidemia, unspecified: Secondary | ICD-10-CM | POA: Diagnosis not present

## 2021-08-23 NOTE — Progress Notes (Signed)
Chief Complaint  ?Patient presents with  ? discuss test  ? ? ?Subjective: ?Patient is a 66 y.o. male here for f/u. ? ?Hx of high cholesterol on Zocor 20 mg/d. Reports compliance w no ae's. Had a friend who had a CACS performed and required a bypass surgery. Interested in having his own done, he is aware it would be $ out of pocket as ins will not cover.  ? ?Past Medical History:  ?Diagnosis Date  ? Allergy   ? High cholesterol   ? Hypertension   ? ? ?Objective: ?BP 120/80   Pulse 80   Temp 97.9 ?F (36.6 ?C) (Oral)   Ht 5' 8.5" (1.74 m)   Wt 227 lb (103 kg)   SpO2 95%   BMI 34.01 kg/m?  ?General: Awake, appears stated age ?Heart: RRR, no LE edema, no bruits ?Lungs: CTAB, no rales, wheezes or rhonchi. No accessory muscle use ?Psych: Age appropriate judgment and insight, normal affect and mood ? ?Assessment and Plan: ?Hyperlipidemia, unspecified hyperlipidemia type - Plan: CT CARDIAC SCORING (SELF PAY ONLY) ? ?Counseled on diet/exercise. Will ck CACS and go from there. Could change Zocor to Lipitor or Crestor. F/u as originally scheduled pending results.  ?The patient voiced understanding and agreement to the plan. ? ?[name], DO ?08/23/21  ?8:24 AM ? ? ? ? ?

## 2021-08-23 NOTE — Patient Instructions (Signed)
Someone will reach out regarding your scan.  ? ?Healthy Eating Plan ?Many factors influence your heart health, including eating and exercise habits. Heart (coronary) risk increases with abnormal blood fat (lipid) levels. Heart-healthy meal planning includes limiting unhealthy fats, increasing healthy fats, and making other small dietary changes. This includes maintaining a healthy body weight to help keep lipid levels within a normal range. ? ?WHAT IS MY PLAN?  ?Your health care provider recommends that you: ?Drink a glass of water before meals to help with satiety. ?Eat slowly. ?An alternative to the water is to add Metamucil. This will help with satiety as well. It does contain calories, unlike water. ? ?WHAT TYPES OF FAT SHOULD I CHOOSE? ?Choose healthy fats more often. Choose monounsaturated and polyunsaturated fats, such as olive oil and canola oil, flaxseeds, walnuts, almonds, and seeds. ?Eat more omega-3 fats. Good choices include salmon, mackerel, sardines, tuna, flaxseed oil, and ground flaxseeds. Aim to eat fish at least two times each week. ?Avoid foods with partially hydrogenated oils in them. These contain trans fats. Examples of foods that contain trans fats are stick margarine, some tub margarines, cookies, crackers, and other baked goods. If you are going to avoid a fat, this is the one to avoid! ? ?WHAT GENERAL GUIDELINES DO I NEED TO FOLLOW? ?Check food labels carefully to identify foods with trans fats. Avoid these types of options when possible. ?Fill one half of your plate with vegetables and green salads. Eat 4-5 servings of vegetables per day. A serving of vegetables equals 1 cup of raw leafy vegetables, ? cup of raw or cooked cut-up vegetables, or ? cup of vegetable juice. ?Fill one fourth of your plate with whole grains. Look for the word "whole" as the first word in the ingredient list. ?Fill one fourth of your plate with lean protein foods. ?Eat 4-5 servings of fruit per day. A serving of  fruit equals one medium whole fruit, ? cup of dried fruit, ? cup of fresh, frozen, or canned fruit. Try to avoid fruits in cups/syrups as the sugar content can be high. ?Eat more foods that contain soluble fiber. Examples of foods that contain this type of fiber are apples, broccoli, carrots, beans, peas, and barley. Aim to get 20-30 g of fiber per day. ?Eat more home-cooked food and less restaurant, buffet, and fast food. ?Limit or avoid alcohol. ?Limit foods that are high in starch and sugar. ?Avoid fried foods when able. ?Cook foods by using methods other than frying. Baking, boiling, grilling, and broiling are all great options. Other fat-reducing suggestions include: ?Removing the skin from poultry. ?Removing all visible fats from meats. ?Skimming the fat off of stews, soups, and gravies before serving them. ?Steaming vegetables in water or broth. ?Lose weight if you are overweight. Losing just 5-10% of your initial body weight can help your overall health and prevent diseases such as diabetes and heart disease. ?Increase your consumption of nuts, legumes, and seeds to 4-5 servings per week. One serving of dried beans or legumes equals ? cup after being cooked, one serving of nuts equals 1? ounces, and one serving of seeds equals ? ounce or 1 tablespoon. ? ?WHAT ARE GOOD FOODS CAN I EAT? ?Grains ?Grainy breads (try to find bread that is 3 g of fiber per slice or greater), oatmeal, light popcorn. Whole-grain cereals. Rice and pasta, including brown rice and those that are made with whole wheat. Edamame pasta is a great alternative to grain pasta. It has a higher protein  content. Try to avoid significant consumption of white bread, sugary cereals, or pastries/baked goods. ? ?Vegetables ?All vegetables. Cooked white potatoes do not count as vegetables. ? ?Fruits ?All fruits, but limit pineapple and bananas as these fruits have a higher sugar content. ? ?Meats and Other Protein Sources ?Lean, well-trimmed beef,  veal, pork, and lamb. Chicken and [place] without skin. All fish and shellfish. Wild duck, rabbit, pheasant, and venison. Egg whites or low-cholesterol egg substitutes. Dried beans, peas, lentils, and tofu. Seeds and most nuts. ? ?Dairy ?Low-fat or nonfat cheeses, including ricotta, string, and mozzarella. Skim or 1% milk that is liquid, powdered, or evaporated. Buttermilk that is made with low-fat milk. Nonfat or low-fat yogurt. Soy/Almond milk are good alternatives if you cannot handle dairy. ? ?Beverages ?Water is the best for you. Sports drinks with less sugar are more desirable unless you are a highly active athlete. ? ?Sweets and Desserts ?Sherbets and fruit ices. Honey, jam, marmalade, jelly, and syrups. Dark chocolate.  ?Eat all sweets and desserts in moderation. ? ?Fats and Oils ?Nonhydrogenated (trans-free) margarines. Vegetable oils, including soybean, sesame, sunflower, olive, peanut, safflower, corn, canola, and cottonseed. Salad dressings or mayonnaise that are made with a vegetable oil. Limit added fats and oils that you use for cooking, baking, salads, and as spreads. ? ?Other ?Cocoa powder. Coffee and tea. Most condiments. ? ?The items listed above may not be a complete list of recommended foods or beverages. Contact your dietitian for more options. ?

## 2021-08-23 NOTE — Progress Notes (Deleted)
Chief Complaint  ?Patient presents with  ? discuss test  ? ? ?Subjective ?Tracy Smith presents for f/u anxiety. ? ?Pt is currently not on any medication though it had been discussed in the past.  ?No thoughts of harming self or others. ?No self-medication with alcohol, prescription drugs or illicit drugs. ?Pt is following with a counselor/psychologist. ? ?Past Medical History:  ?Diagnosis Date  ? Allergy   ? High cholesterol   ? Hypertension   ? ?Allergies as of 08/23/2021   ? ?   Reactions  ? Bee Venom   ? swelling  ? No Known Allergies   ? ?  ? ?  ?Medication List  ?  ? ?  ? Accurate as of August 23, 2021  8:10 AM. If you have any questions, ask your nurse or doctor.  ?  ?  ? ?  ? ?amLODipine-benazepril 5-40 MG capsule ?Commonly known as: LOTREL ?TAKE 1 CAPSULE BY MOUTH IN THE EVENING ?  ?aspirin EC 81 MG tablet ?Take 81 mg by mouth every evening. ?  ?cetirizine 10 MG tablet ?Commonly known as: ZYRTEC ?Take 10 mg by mouth daily. ?  ?famotidine 20 MG tablet ?Commonly known as: PEPCID ?Take 1 tablet (20 mg total) by mouth 2 (two) times daily. ?  ?montelukast 10 MG tablet ?Commonly known as: SINGULAIR ?TAKE 1 TABLET BY MOUTH EVERY DAY IN THE EVENING ?  ?pantoprazole 40 MG tablet ?Commonly known as: PROTONIX ?Take 1 tablet (40 mg total) by mouth daily. ?  ?simvastatin 20 MG tablet ?Commonly known as: ZOCOR ?TAKE 1 TABLET BY MOUTH EVERY DAY IN THE EVENING ?  ? ?  ? ? ?Exam ?BP 120/80   Pulse 80   Temp 97.9 ?F (36.6 ?C) (Oral)   Ht 5' 8.5" (1.74 m)   Wt 227 lb (103 kg)   SpO2 95%   BMI 34.01 kg/m?  ?General:  well developed, well nourished, in no apparent distress ?Lungs:  No respiratory distress ?Psych: well oriented with normal range of affect and age-appropriate judgement/insight, alert and oriented x4. ? ?Assessment and Plan ? ?No diagnosis found. ? ?F/u in ***. ?The patient voiced understanding and agreement to the plan. ? ?[name], DO ?08/23/21 ?8:10 AM ? ?

## 2021-09-02 ENCOUNTER — Ambulatory Visit (HOSPITAL_COMMUNITY)
Admission: RE | Admit: 2021-09-02 | Discharge: 2021-09-02 | Disposition: A | Discharge status: Home / Self Care | Payer: Self-pay | Source: Ambulatory Visit | Attending: Family Medicine | Admitting: Family Medicine

## 2021-09-02 DIAGNOSIS — E785 Hyperlipidemia, unspecified: Secondary | ICD-10-CM

## 2021-09-03 ENCOUNTER — Encounter: Payer: Self-pay | Admitting: Family Medicine

## 2021-09-03 ENCOUNTER — Other Ambulatory Visit: Payer: Self-pay | Admitting: Family Medicine

## 2021-09-03 DIAGNOSIS — R931 Abnormal findings on diagnostic imaging of heart and coronary circulation: Secondary | ICD-10-CM

## 2021-09-03 MED ORDER — ROSUVASTATIN CALCIUM 20 MG PO TABS: 20.0000 mg | ORAL_TABLET | Freq: Every day | ORAL | 3 refills | Status: DC

## 2021-09-05 ENCOUNTER — Other Ambulatory Visit: Payer: Self-pay | Admitting: Family Medicine

## 2021-09-05 ENCOUNTER — Telehealth: Payer: Self-pay | Admitting: Family Medicine

## 2021-09-05 ENCOUNTER — Encounter: Payer: Self-pay | Admitting: Family Medicine

## 2021-09-05 DIAGNOSIS — R931 Abnormal findings on diagnostic imaging of heart and coronary circulation: Secondary | ICD-10-CM

## 2021-09-05 NOTE — Telephone Encounter (Signed)
Referral done

## 2021-09-05 NOTE — Telephone Encounter (Signed)
Wife does not want pt to be seen at [facility] and needs the referral sent to [place], [place],  [id number], [phone] ?

## 2021-09-05 NOTE — Telephone Encounter (Signed)
Wife wanted to know where the cardiology referral was sent for her husband, she stated that if it was sent to [facility] cardiology she would give up her appointment time so her husband could be seen sooner.  ? ? ?Doris L. Besancon  ?MRN: [id number] ?

## 2021-09-08 ENCOUNTER — Encounter: Payer: Self-pay | Admitting: Cardiology

## 2021-09-08 ENCOUNTER — Ambulatory Visit: Payer: BC Managed Care – PPO | Admitting: Cardiology

## 2021-09-08 VITALS — BP 130/88 | HR 81 | Temp 98.0°F | Resp 16 | Ht 68.0 in | Wt 226.0 lb

## 2021-09-08 DIAGNOSIS — R0789 Other chest pain: Secondary | ICD-10-CM | POA: Diagnosis not present

## 2021-09-08 DIAGNOSIS — E782 Mixed hyperlipidemia: Secondary | ICD-10-CM

## 2021-09-08 DIAGNOSIS — R931 Abnormal findings on diagnostic imaging of heart and coronary circulation: Secondary | ICD-10-CM

## 2021-09-08 DIAGNOSIS — I10 Essential (primary) hypertension: Secondary | ICD-10-CM

## 2021-09-08 NOTE — Progress Notes (Signed)
? ? ?Patient referred by Tracy Smith* for elevated coronary calcium ? ?Subjective:  ? ?Tracy Smith, male    DOB: June 03, 1955, 66 y.o.   MRN: [id number] ? ? ?Chief Complaint  ?Patient presents with  ? Hypertension  ? New Patient (Initial Visit)  ?  Ct score  ? ? ? ?HPI ? ?66 y.o. Tracy Smith male with controlled hypertension, hyperlipidemia, elevated coronary calcium ? 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 He recently had a screening CT cardiac scoring scan that showed 91st percentile calcium, much to his surprise. 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 His wofe reports that she has seen him running his hand on his chest, ans also complaining of left arm pain sometimes when he is working on the cars. Other than that, he has no symptoms.  ? ?Since the CT scan, he is now on Aspirin 81 mg, and Crestor in place of previously used simvastatin. He admits to eating "[place] meat and cheese diet', but on specific questioning, only occasionally eats red meat. His father had MI at age 14. Patient is non-diabetic, non-smoker.  ? ? ?Past Medical History:  ?Diagnosis Date  ? Allergy   ? High cholesterol   ? Hypertension   ? ? ? ?Past Surgical History:  ?Procedure Laterality Date  ? CHOLECYSTECTOMY N/A 11/21/2016  ? Procedure: LAPAROSCOPIC CHOLECYSTECTOMY;  Surgeon: Tracy Skeans, MD;  Location: [place];  Service: General;  Laterality: N/A;  ? COLONOSCOPY    ? ? ? ?Social History  ? ?Tobacco Use  ?Smoking Status Never  ?Smokeless Tobacco Never  ? ? ?Social History  ? ?Substance and Sexual Activity  ?Alcohol Use No  ? ? ? ?Family History  ?Problem Relation Age of Onset  ? High blood pressure Father   ? Heart disease Father   ? Colon cancer Neg Hx   ? Colon polyps Neg Hx   ? Esophageal cancer Neg Hx   ? Rectal cancer Neg Hx   ? Stomach cancer Neg Hx   ? ? ? ? ?Current Outpatient  Medications:  ?  amLODipine-benazepril (LOTREL) 5-40 MG capsule, TAKE 1 CAPSULE BY MOUTH IN THE EVENING, Disp: 90 capsule, Rfl: 3 ?  aspirin EC 81 MG tablet, Take 81 mg by mouth every evening. , Disp: , Rfl:  ?  cetirizine (ZYRTEC) 10 MG tablet, Take 10 mg by mouth daily., Disp: , Rfl:  ?  montelukast (SINGULAIR) 10 MG tablet, TAKE 1 TABLET BY MOUTH EVERY DAY IN THE EVENING, Disp: 90 tablet, Rfl: 1 ?  pantoprazole (PROTONIX) 40 MG tablet, Take 1 tablet (40 mg total) by mouth daily., Disp: 30 tablet, Rfl: 1 ?  rosuvastatin (CRESTOR) 20 MG tablet, Take 1 tablet (20 mg total) by mouth daily., Disp: 30 tablet, Rfl: 3 ? ? ?Cardiovascular and other pertinent studies: ? ?Reviewed external labs and tests, independently interpreted ? ?EKG 09/08/2021: ?Sinus rhythm 87 bpm ?Left anterior fascicular block ? ?CT cardiac scoring 09/02/2021: ?Left main: 0  ?LAD: 472 ?Lcx: 72 ?RCA: 151 ?Total: 695 ?Percentile: 91 ? ? ?Recent labs: ?03/25/2021: ?Glucose 103, BUN/Cr 20/1/16. EGFR 67. Na/K 138/4.3. Rest of the CMP normal ?HbA1C 6.0% ?Chol 174, TG 118, HDL 42, LDL 108 ? ? ?Review of Systems  ?Cardiovascular:  Positive for chest pain. Negative for dyspnea on exertion, leg swelling, palpitations and syncope.  ? ?   ? ? ?Vitals:  ?  09/08/21 1251 09/08/21 1301  ?BP: (!) 145/100 130/88  ?Pulse: 84 81  ?Resp: 16   ?Temp: 98 ?F (36.7 ?C)   ?SpO2: 96%   ? ? ? ?Body mass index is 34.36 kg/m?. Danley Danker Weights  ? 09/08/21 1251  ?Weight: 226 lb (102.5 kg)  ? ? ? ?Objective:  ? Physical Exam ?Vitals and nursing note reviewed.  ?Constitutional:   ?   General: He is not in acute distress. ?   Comments: Muscular built  ?Neck:  ?   Vascular: No JVD.  ?Cardiovascular:  ?   Rate and Rhythm: Normal rate and regular rhythm.  ?   Heart sounds: Normal heart sounds. No murmur heard. ?Pulmonary:  ?   Effort: Pulmonary effort is normal.  ?   Breath sounds: Normal breath sounds. No wheezing or rales.  ?Musculoskeletal:  ?   Right lower leg: No edema.  ?   Left  lower leg: No edema.  ? ? ? ? ? ?   ? ?Visit diagnoses: ?  ICD-10-CM   ?1. Elevated coronary artery calcium score  R93.1 PCV ECHOCARDIOGRAM COMPLETE  ?  PCV MYOCARDIAL PERFUSION WO LEXISCAN  ?  Lipoprotein A (LPA)  ?  ?2. Atypical chest pain  R07.89 PCV ECHOCARDIOGRAM COMPLETE  ?  PCV MYOCARDIAL PERFUSION WO LEXISCAN  ?  ?3. Mixed hyperlipidemia  E78.2   ?  ?4. Essential hypertension  I10 EKG 12-Lead  ?  ?  ? ?Orders Placed This Encounter  ?Procedures  ? Lipoprotein A (LPA)  ? PCV MYOCARDIAL PERFUSION WO LEXISCAN  ? EKG 12-Lead  ? PCV ECHOCARDIOGRAM COMPLETE  ?  ? ?Assessment & Recommendations:  ? ?66 y.o. Tracy Smith male with controlled hypertension, hyperlipidemia, elevated coronary calcium ? ?Elevated coronary calcium: ?Multivessel calcium 91st percentile. None in LM. ?Atypical chest pain symptoms. ?Recommend echocardiogram and exercise nuclear stress test for risk stratification.  ?Continue Aspirin, Crestor. Check lipoprotein (a). ?Discussed heart healthy diet and lifestyle.  ? ?Hypertension: ?Controlled ? ?Mixed hyperlipidemia: ?Recently switched from simvastatin to Crestor. Will recheck lipid panel in 3 months.  ? ?Further recommendations after above testing. ? ?Thank you for referring the patient to [place]. Please feel free to contact with any questions. ? ? ?Tracy Mormon, MD ?Pager: [phone] ?Office: [phone] ?

## 2021-09-09 ENCOUNTER — Ambulatory Visit: Payer: BC Managed Care – PPO

## 2021-09-09 DIAGNOSIS — R0789 Other chest pain: Secondary | ICD-10-CM | POA: Diagnosis not present

## 2021-09-09 DIAGNOSIS — R931 Abnormal findings on diagnostic imaging of heart and coronary circulation: Secondary | ICD-10-CM

## 2021-09-09 LAB — LIPOPROTEIN A (LPA): Lipoprotein (a): 38 nmol/L (ref <75.0)

## 2021-09-13 ENCOUNTER — Encounter: Payer: Self-pay | Admitting: Cardiology

## 2021-09-13 ENCOUNTER — Ambulatory Visit: Payer: BC Managed Care – PPO

## 2021-09-13 DIAGNOSIS — R0789 Other chest pain: Secondary | ICD-10-CM | POA: Diagnosis not present

## 2021-09-13 DIAGNOSIS — R931 Abnormal findings on diagnostic imaging of heart and coronary circulation: Secondary | ICD-10-CM | POA: Diagnosis not present

## 2021-09-14 NOTE — Telephone Encounter (Signed)
From patient.

## 2021-09-22 ENCOUNTER — Encounter: Payer: Self-pay | Admitting: Family Medicine

## 2021-09-22 DIAGNOSIS — R931 Abnormal findings on diagnostic imaging of heart and coronary circulation: Secondary | ICD-10-CM

## 2021-10-08 ENCOUNTER — Other Ambulatory Visit: Payer: Self-pay | Admitting: Family Medicine

## 2021-10-13 ENCOUNTER — Ambulatory Visit: Payer: BC Managed Care – PPO | Admitting: Cardiology

## 2021-10-16 ENCOUNTER — Other Ambulatory Visit: Payer: Self-pay | Admitting: Family Medicine

## 2021-10-17 ENCOUNTER — Telehealth: Payer: Self-pay | Admitting: Family Medicine

## 2021-10-17 MED ORDER — FAMOTIDINE 20 MG PO TABS
20.0000 mg | ORAL_TABLET | Freq: Two times a day (BID) | ORAL | 3 refills | Status: DC
Start: 2021-10-17 — End: 2023-01-05

## 2021-10-17 NOTE — Telephone Encounter (Signed)
Doris (spouse DPR OK) called stating pt needed a refill on his Famotidine. She was informed that a request was submitted to the office for this medication and Dr. Nani Ravens should be reviewing that shortly.

## 2021-10-17 NOTE — Addendum Note (Signed)
Addended by: Ames Coupe on: 10/17/2021 10:27 AM   Modules accepted: Orders

## 2022-01-31 ENCOUNTER — Other Ambulatory Visit: Payer: Self-pay | Admitting: Family Medicine

## 2022-03-20 ENCOUNTER — Encounter: Payer: Self-pay | Admitting: Cardiology

## 2022-03-20 ENCOUNTER — Ambulatory Visit: Payer: BC Managed Care – PPO | Admitting: Cardiology

## 2022-03-20 VITALS — BP 133/87 | HR 79 | Resp 16 | Ht 68.0 in | Wt 223.0 lb

## 2022-03-20 DIAGNOSIS — R931 Abnormal findings on diagnostic imaging of heart and coronary circulation: Secondary | ICD-10-CM | POA: Diagnosis not present

## 2022-03-20 DIAGNOSIS — I10 Essential (primary) hypertension: Secondary | ICD-10-CM

## 2022-03-20 DIAGNOSIS — E782 Mixed hyperlipidemia: Secondary | ICD-10-CM | POA: Diagnosis not present

## 2022-03-20 NOTE — Progress Notes (Signed)
Patient referred by [name]* for elevated coronary calcium  Subjective:   Tracy Smith, male    DOB: 04-03-1956, 66 y.o.   MRN: [id number]   Chief Complaint  Patient presents with   Elevated coronary artery calcium score   Follow-up    6 month    HPI  66 y.o. [name] with controlled hypertension, hyperlipidemia, elevated coronary calcium  Patient is doing well. He denies chest pain, shortness of breath, palpitations, leg edema, orthopnea, PND, TIA/syncope.   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 He recently had a screening CT cardiac scoring scan that showed 91st percentile calcium, much to his surprise. 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 His wofe reports that she has seen him running his hand on his chest, ans also complaining of left arm pain sometimes when he is working on the cars. Other than that, he has no symptoms.   Since the CT scan, he is now on Aspirin 81 mg, and Crestor in place of previously used simvastatin. He admits to eating "[place] meat and cheese diet', but on specific questioning, only occasionally eats red meat. His father had MI at age 60. Patient is non-diabetic, non-smoker.    Current Outpatient Medications:    amLODipine-benazepril (LOTREL) 5-40 MG capsule, TAKE 1 CAPSULE BY MOUTH IN THE EVENING, Disp: 90 capsule, Rfl: 3   aspirin EC 81 MG tablet, Take 81 mg by mouth every evening. , Disp: , Rfl:    cetirizine (ZYRTEC) 10 MG tablet, Take 10 mg by mouth daily., Disp: , Rfl:    famotidine (PEPCID) 20 MG tablet, Take 1 tablet (20 mg total) by mouth 2 (two) times daily., Disp: 180 tablet, Rfl: 3   montelukast (SINGULAIR) 10 MG tablet, TAKE 1 TABLET BY MOUTH EVERY DAY IN THE EVENING, Disp: 90 tablet, Rfl: 1   pantoprazole (PROTONIX) 40 MG tablet, Take 1  tablet (40 mg total) by mouth daily., Disp: 30 tablet, Rfl: 1   rosuvastatin (CRESTOR) 20 MG tablet, TAKE 1 TABLET BY MOUTH EVERY DAY, Disp: 90 tablet, Rfl: 1   Cardiovascular and other pertinent studies:  Reviewed external labs and tests, independently interpreted  EKG 03/20/2022: Sinus rhythm 78 bpm  LAFB  Exercise Sestamibi stress test 09/13/2021: Exercise nuclear stress test was performed using Bruce protocol. Patient reached 8 METS, and 86% of age predicted maximum heart rate. Exercise capacity was good. No chest pain reported. Heart rate and hemodynamic response were normal. Stress EKG revealed no ischemic changes. Normal myocardial perfusion. Stress LVEF 67%. Low risk study.  Echocardiogram 09/09/2021:  Left ventricle cavity is normal in size and wall thickness. Normal global  wall motion. Normal LV systolic function with EF 60%. Normal diastolic  filling pattern.  Mild (Grade I) mitral regurgitation.  No evidence of pulmonary hypertension.   CT cardiac scoring 09/02/2021: Left main: 0  LAD: 472 Lcx: 72 RCA: 151 Total: 695 Percentile: 91   Recent labs: 03/25/2021: Glucose 103, BUN/Cr 20/1/16. EGFR 67. Na/K 138/4.3. Rest of the CMP normal HbA1C 6.0% Chol 174, TG 118, HDL 42, LDL 108   Review of Systems  Cardiovascular:  Positive for chest pain. Negative for dyspnea on exertion, leg swelling, palpitations and syncope.         Vitals:   03/20/22 0958 03/20/22 1004  BP: (!) 132/101 133/87  Pulse: 88  79  Resp: 16   SpO2: 96% 96%     Body mass index is 35.43 kg/m. Filed Weights   03/20/22 0958  Weight: 233 lb (105.7 kg)     Objective:   Physical Exam Vitals and nursing note reviewed.  Constitutional:      General: He is not in acute distress.    Comments: Muscular built  Neck:     Vascular: No JVD.  Cardiovascular:     Rate and Rhythm: Normal rate and regular rhythm.     Heart sounds: Normal heart sounds. No murmur heard. Pulmonary:     Effort:  Pulmonary effort is normal.     Breath sounds: Normal breath sounds. No wheezing or rales.  Musculoskeletal:     Right lower leg: No edema.     Left lower leg: No edema.            Visit diagnoses:   ICD-10-CM   1. Elevated coronary artery calcium score  R93.1 EKG 12-Lead       Orders Placed This Encounter  Procedures   EKG 12-Lead     Assessment & Recommendations:   66 y.o. [name] with controlled hypertension, hyperlipidemia, elevated coronary calcium  Elevated coronary calcium: Multivessel calcium 91st percentile. None in LM. Atypical chest pain symptoms. Structurally normal heart, no ischemia (03/2021). Continue Aspirin in absence of bleeding issues. Continue Crestor 20 mg daily. Check repeat lipid panel, lipoprotein (a). Discussed heart healthy diet and lifestyle.   Hypertension: Controlled  Mixed hyperlipidemia: Recently switched from simvastatin to Crestor. Will recheck lipid panel in 3 months.   F/u in 1 year   Tracy Mormon, MD Pager: [phone] Office: [phone]

## 2022-03-21 LAB — LIPID PANEL
Chol/HDL Ratio: 3.1 ratio (ref 0.0–5.0)
Cholesterol, Total: 144 mg/dL (ref 100–199)
HDL: 46 mg/dL (ref >39)
LDL Chol Calc (NIH): 83 mg/dL (ref 0–99)
Triglycerides: 76 mg/dL (ref 0–149)
VLDL Cholesterol Cal: 15 mg/dL (ref 5–40)

## 2022-03-21 LAB — LIPOPROTEIN A (LPA): Lipoprotein (a): 39.9 nmol/L (ref <75.0)

## 2022-03-23 ENCOUNTER — Other Ambulatory Visit: Payer: Self-pay

## 2022-03-23 MED ORDER — ROSUVASTATIN CALCIUM 40 MG PO TABS: 40.0000 mg | ORAL_TABLET | Freq: Every day | ORAL | 3 refills | Status: DC

## 2022-03-23 NOTE — Progress Notes (Signed)
Tried calling patient no answer left a vm

## 2022-03-23 NOTE — Progress Notes (Signed)
Lipoprotein (a) is normal. LDL is better but could be better. Please increase Crestor to 40 mg daily. Please send 90 pillsX3 refills.  Thanks MJP

## 2022-03-23 NOTE — Progress Notes (Signed)
Called and spoke to patient he voiced understanding and medication has been sent

## 2022-03-31 ENCOUNTER — Encounter: Payer: Self-pay | Admitting: Cardiology

## 2022-03-31 NOTE — Telephone Encounter (Signed)
From pt

## 2022-05-19 IMAGING — CT CT CARDIAC CORONARY ARTERY CALCIUM SCORE
3 series · 14 of 20 positions shown, 15 images · non-contrast
Comparison: None.

Addendum:
:
Cardiovascular Disease Risk stratification

EXAM:
Coronary Calcium Score
TECHNIQUE: A gated, non-contrast computed tomography scan of the heart was
performed using 3mm slice thickness. Axial images were analyzed on a
dedicated workstation. Calcium scoring of the coronary arteries was
performed using the Agatston method.

[Series 3: ax ca scr 70% (id) · axial · 0.39mm/px · z∈[+1468,+1560]mm · 6 of 66 slices shown]
[im 10/66  vessel]
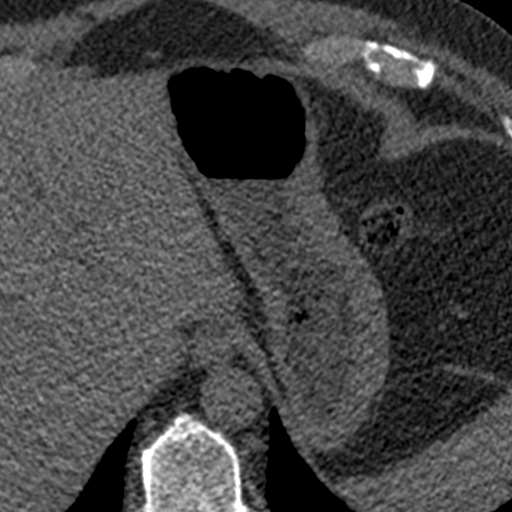
[im 19/66  vessel]
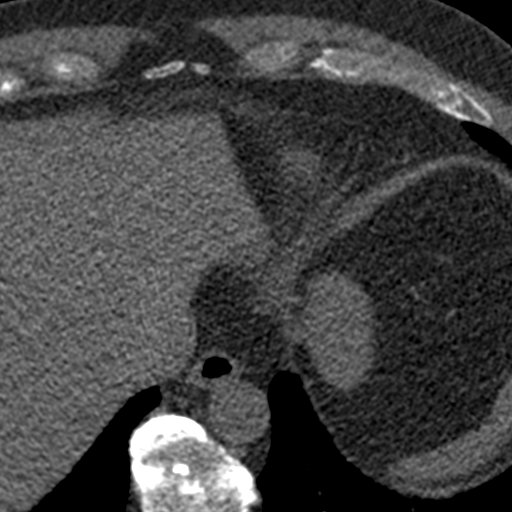
[im 28/66  vessel]
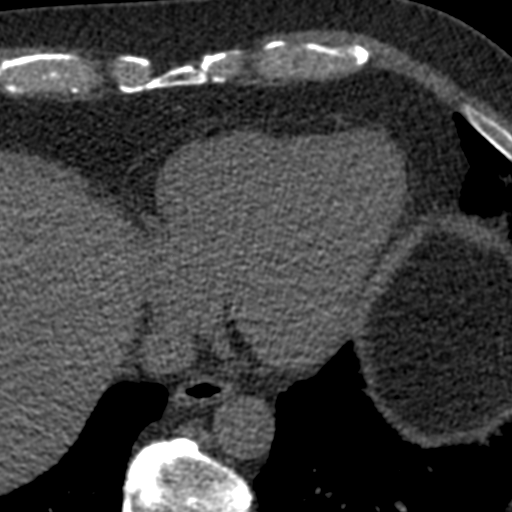
[im 38/66  vessel]
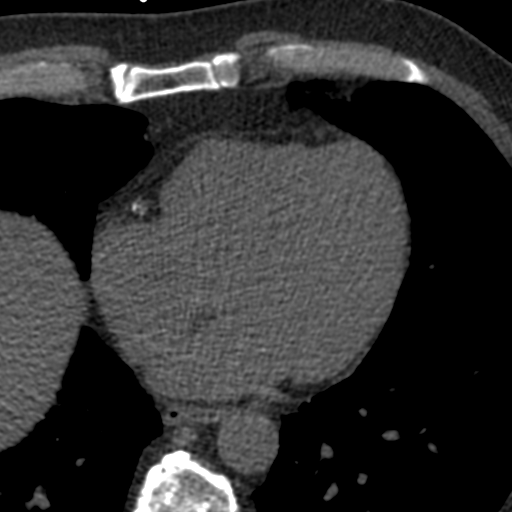
[im 47/66  vessel]
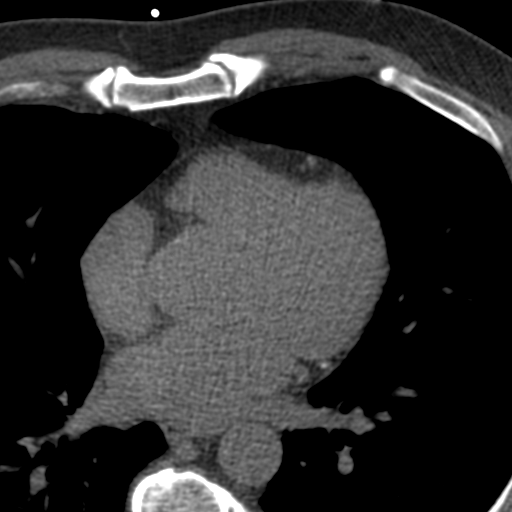
[im 56/66  vessel]
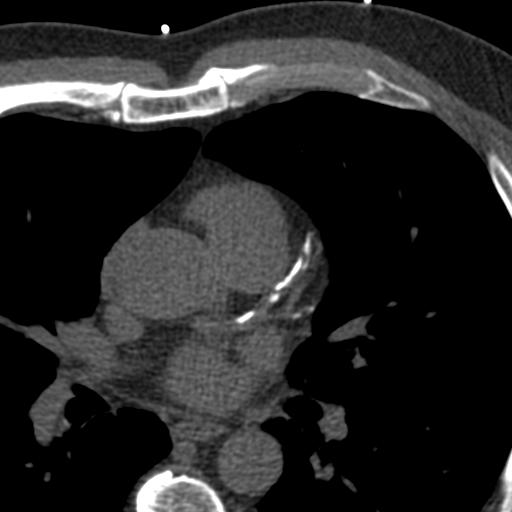

[Series 4: ax st · axial · 0.78mm/px · z∈[+1474,+1552]mm · 4 of 44 slices shown, 5 images]
[im 9/44  vessel]
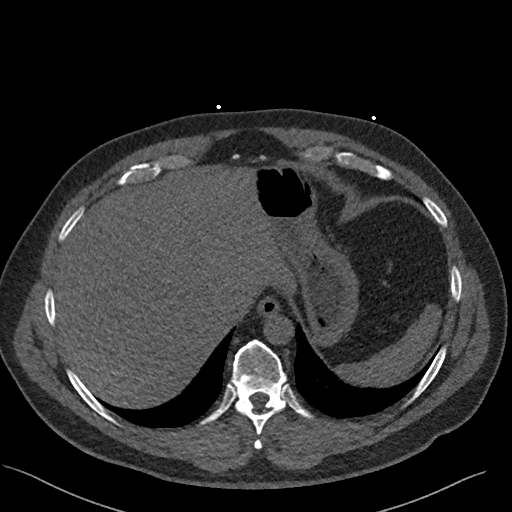
[im 9/44  lung]
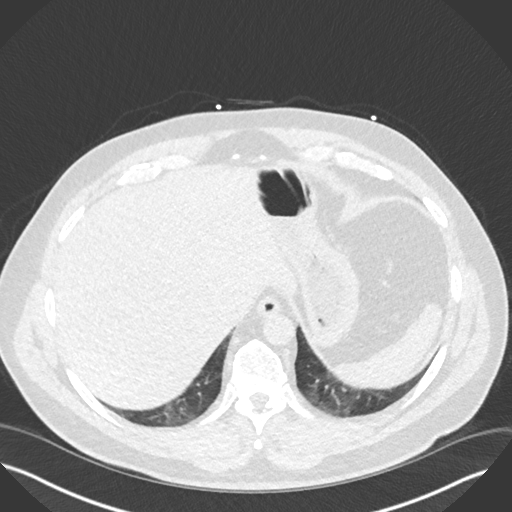
[im 18/44  vessel]
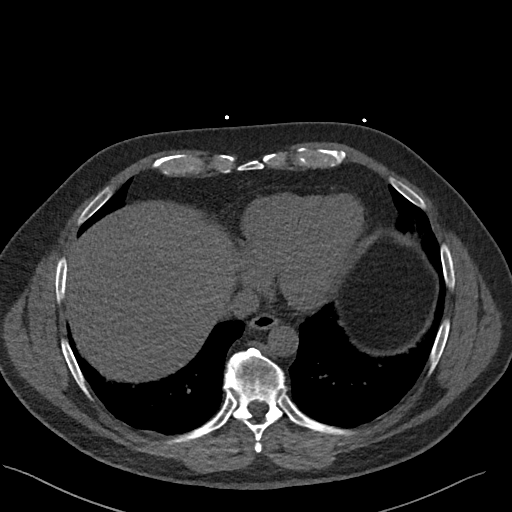
[im 26/44  vessel]
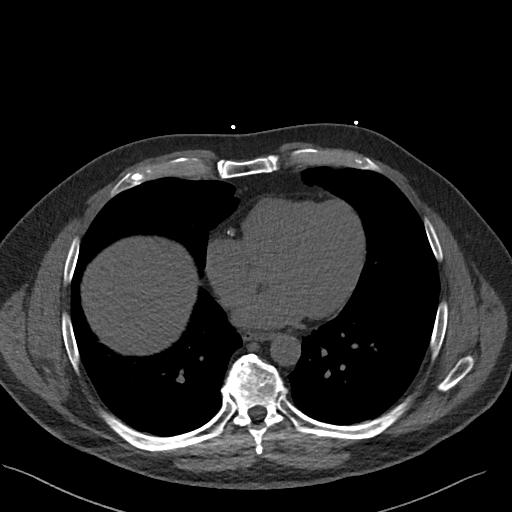
[im 35/44  vessel]
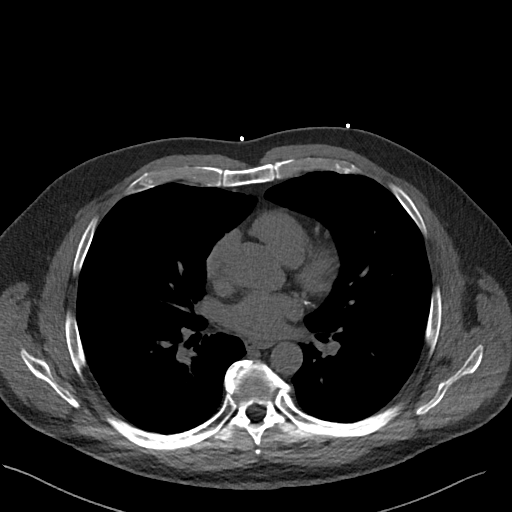

[Series 5: ax lung · axial · 0.78mm/px · z∈[+1474,+1552]mm · 4 of 44 slices shown]
[im 9/44  lung]
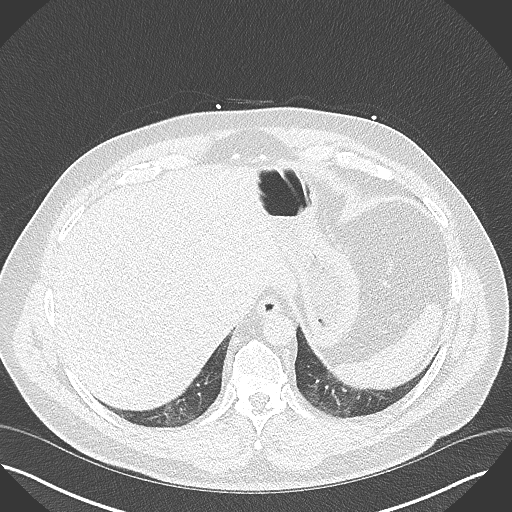
[im 18/44  lung]
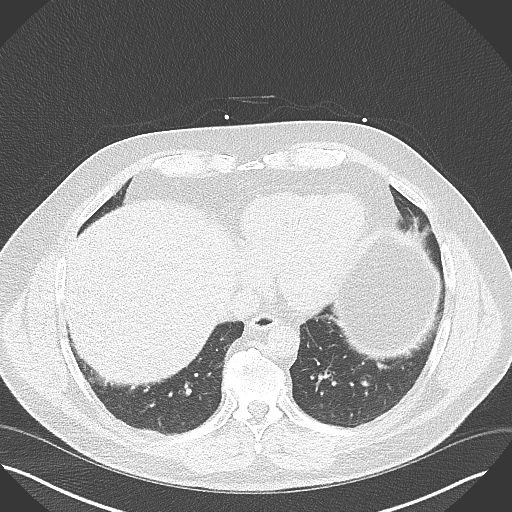
[im 26/44  lung]
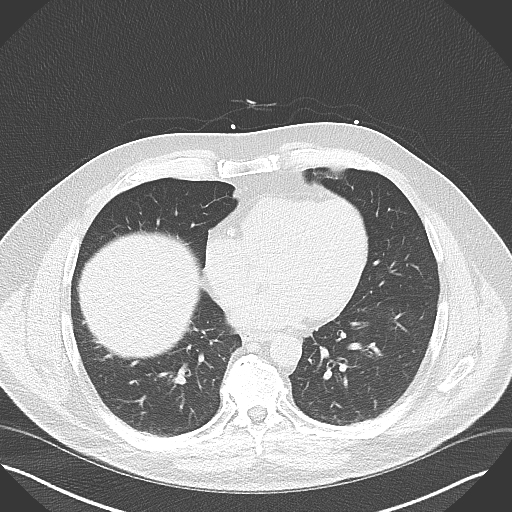
[im 35/44  lung]
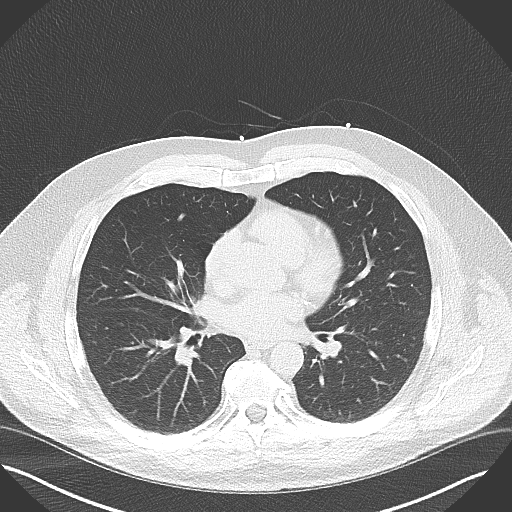

[14 of 20 positions shown; findings below may reference images not displayed]

FINDINGS: Coronary arteries: Normal origins.

Coronary Calcium Score:

Left main: 0

Left anterior descending artery: 472

Left circumflex artery: 72

Right coronary artery: 151

Total: 695

Percentile: 91

Pericardium: Normal.

Ascending Aorta: Normal caliber.

Non-cardiac: See separate report from [REDACTED].
IMPRESSION: Coronary calcium score of 695. This was 91 percentile for age-,
race-, and sex-matched controls.



If CAC=0, it is reasonable to withhold statin therapy and reassess
in 5 to 10 years, as long as higher risk conditions are absent
(diabetes mellitus, family history of premature CHD in first degree
relatives (males <55 years; females <65 years), cigarette smoking,
or LDL >=190 mg/dL).

If CAC is 1 to 99, it is reasonable to initiate statin therapy for
patients >=55 years of age.

If CAC is >=100 or >=75th percentile, it is reasonable to initiate
statin therapy at any age.

Cardiology referral should be considered for patients with CAC
scores >=400 or >=75th percentile.

*1648 AHA/ACC/AACVPR/AAPA/ABC/ADIMUHSAN/JIM/MENGESHA/Klpigbb/TIGER/FALLON/WILLIAM RODRIGUEZ
Guideline on the Management of Blood Cholesterol: A Report of the
American College of Cardiology/American Heart Association Task Force
on Clinical Practice Guidelines. J Am Coll Cardiol.
7301;73(24):0220-0407.

Giorgi Jumper, DO [REDACTED]

The noncardiac portion of this study will be interpreted in separate
report by the radiologist.

EXAM:
OVER-READ INTERPRETATION  CT CHEST

The following report is an over-read performed by radiologist Dr.
over-read does not include interpretation of cardiac or coronary
anatomy or pathology. The interpretation by the cardiologist is
attached.
FINDINGS: No acute extracardiac findings.
IMPRESSION: No acute extracardiac findings.

*** End of Addendum ***
:
Cardiovascular Disease Risk stratification

EXAM:
Coronary Calcium Score
FINDINGS: Coronary arteries: Normal origins.

Coronary Calcium Score:

Left main: 0

Left anterior descending artery: 472

Left circumflex artery: 72

Right coronary artery: 151

Total: 695

Percentile: 91

Pericardium: Normal.

Ascending Aorta: Normal caliber.

Non-cardiac: See separate report from [REDACTED].
IMPRESSION: Coronary calcium score of 695. This was 91 percentile for age-,
race-, and sex-matched controls.



If CAC=0, it is reasonable to withhold statin therapy and reassess
in 5 to 10 years, as long as higher risk conditions are absent
(diabetes mellitus, family history of premature CHD in first degree
relatives (males <55 years; females <65 years), cigarette smoking,
or LDL >=190 mg/dL).

If CAC is 1 to 99, it is reasonable to initiate statin therapy for
patients >=55 years of age.

If CAC is >=100 or >=75th percentile, it is reasonable to initiate
statin therapy at any age.

Cardiology referral should be considered for patients with CAC
scores >=400 or >=75th percentile.

*1648 AHA/ACC/AACVPR/AAPA/ABC/ADIMUHSAN/JIM/MENGESHA/Klpigbb/TIGER/FALLON/WILLIAM RODRIGUEZ
Guideline on the Management of Blood Cholesterol: A Report of the
American College of Cardiology/American Heart Association Task Force
on Clinical Practice Guidelines. J Am Coll Cardiol.
7301;73(24):0220-0407.

Giorgi Jumper, DO [REDACTED]

The noncardiac portion of this study will be interpreted in separate
report by the radiologist.

## 2022-07-07 DIAGNOSIS — H52223 Regular astigmatism, bilateral: Secondary | ICD-10-CM | POA: Diagnosis not present

## 2022-07-07 DIAGNOSIS — H1045 Other chronic allergic conjunctivitis: Secondary | ICD-10-CM | POA: Diagnosis not present

## 2022-07-07 DIAGNOSIS — H524 Presbyopia: Secondary | ICD-10-CM | POA: Diagnosis not present

## 2022-07-07 DIAGNOSIS — H5203 Hypermetropia, bilateral: Secondary | ICD-10-CM | POA: Diagnosis not present

## 2022-07-20 ENCOUNTER — Other Ambulatory Visit: Payer: Self-pay | Admitting: Family Medicine

## 2022-08-10 ENCOUNTER — Other Ambulatory Visit: Payer: Self-pay | Admitting: Family Medicine

## 2022-09-12 NOTE — Telephone Encounter (Signed)
From patient.

## 2022-12-28 ENCOUNTER — Encounter (INDEPENDENT_AMBULATORY_CARE_PROVIDER_SITE_OTHER): Payer: Self-pay

## 2022-12-29 ENCOUNTER — Encounter: Payer: Self-pay | Admitting: Family Medicine

## 2022-12-29 ENCOUNTER — Other Ambulatory Visit: Payer: Self-pay | Admitting: Family Medicine

## 2022-12-29 MED ORDER — ROSUVASTATIN CALCIUM 40 MG PO TABS: 40.0000 mg | ORAL_TABLET | Freq: Every day | ORAL | 0 refills | Status: DC

## 2023-01-05 ENCOUNTER — Other Ambulatory Visit: Payer: Self-pay | Admitting: Family Medicine

## 2023-01-16 ENCOUNTER — Ambulatory Visit (INDEPENDENT_AMBULATORY_CARE_PROVIDER_SITE_OTHER): Payer: PPO | Admitting: Family Medicine

## 2023-01-16 ENCOUNTER — Encounter: Payer: Self-pay | Admitting: Family Medicine

## 2023-01-16 VITALS — BP 121/78 | HR 65 | Temp 98.6°F | Ht 68.5 in | Wt 219.4 lb

## 2023-01-16 DIAGNOSIS — R7303 Prediabetes: Secondary | ICD-10-CM

## 2023-01-16 DIAGNOSIS — Z125 Encounter for screening for malignant neoplasm of prostate: Secondary | ICD-10-CM

## 2023-01-16 DIAGNOSIS — Z0000 Encounter for general adult medical examination without abnormal findings: Secondary | ICD-10-CM | POA: Diagnosis not present

## 2023-01-16 DIAGNOSIS — Z13220 Encounter for screening for lipoid disorders: Secondary | ICD-10-CM | POA: Diagnosis not present

## 2023-01-16 DIAGNOSIS — Z23 Encounter for immunization: Secondary | ICD-10-CM

## 2023-01-16 MED ORDER — ROSUVASTATIN CALCIUM 20 MG PO TABS: 20.0000 mg | ORAL_TABLET | Freq: Every day | ORAL | 3 refills | Status: DC

## 2023-01-16 NOTE — Addendum Note (Signed)
Addended by: Scharlene Gloss B on: 01/16/2023 01:34 PM   Modules accepted: Orders

## 2023-01-16 NOTE — Patient Instructions (Addendum)
Give [place] 2-3 business days to get the results of your labs back.   Keep the diet clean and stay active.  Please get me a copy of your advanced directive form at your convenience.   I recommend getting the flu shot in mid October. This suggestion would change if the CDC comes out with a different recommendation.   The Shingrix vaccine (for shingles) is a 2 shot series spaced 2-6 months apart. It can make people feel low energy, achy and almost like they have the flu for 48 hours after injection. 1/5 people can have nausea and/or vomiting. Please plan accordingly when deciding on when to get this shot. Call your pharmacy to get this. The second shot of the series is less severe regarding the side effects, but it still lasts 48 hours.   Please update your tetanus shot at the pharmacy.   Let us know if you need anything.

## 2023-01-16 NOTE — Progress Notes (Signed)
Chief Complaint  Patient presents with   Annual Exam    Change the Crestor to 20 mg due to cardiologist    Well Male Tracy Smith is here for a complete physical.   His last physical was >1 year ago.  Current diet: in general, a "healthy" diet.   Current exercise: walking, lifting Weight trend: stable Fatigue out of ordinary? No. Seat belt? Yes.   Advanced directive? Yes  Health maintenance Shingrix- No CCS- Yes Tetanus- No Hep C- Yes Pneumonia vaccine- No  Past Medical History:  Diagnosis Date   Allergy    High cholesterol    Hypertension      Past Surgical History:  Procedure Laterality Date   CHOLECYSTECTOMY N/A 11/21/2016   Procedure: LAPAROSCOPIC CHOLECYSTECTOMY;  Surgeon: Violeta Gelinas, MD;  Location: [facility] OR;  Service: General;  Laterality: N/A;   COLONOSCOPY      Medications  Current Outpatient Medications on File Prior to Visit  Medication Sig Dispense Refill   amLODipine-benazepril (LOTREL) 5-40 MG capsule TAKE 1 CAPSULE BY MOUTH IN THE EVENING 90 capsule 3   aspirin EC 81 MG tablet Take 81 mg by mouth every evening.      famotidine (PEPCID) 20 MG tablet TAKE 1 TABLET BY MOUTH TWICE A DAY 180 tablet 1   montelukast (SINGULAIR) 10 MG tablet TAKE 1 TABLET BY MOUTH EVERY DAY IN THE EVENING 90 tablet 1   Allergies Allergies  Allergen Reactions   Bee Venom     swelling   No Known Allergies     Family History Family History  Problem Relation Age of Onset   Heart attack Father    High blood pressure Father    Heart disease Father    Colon cancer Neg Hx    Colon polyps Neg Hx    Esophageal cancer Neg Hx    Rectal cancer Neg Hx    Stomach cancer Neg Hx     Review of Systems: Constitutional:  no fevers Eye:  no recent significant change in vision Ears:  No changes in hearing Nose/Mouth/Throat:  no complaints of nasal congestion, no sore throat Cardiovascular: no chest pain Respiratory:  No shortness of breath Gastrointestinal:  No change in  bowel habits GU:  No frequency Integumentary:  no abnormal skin lesions reported Neurologic:  no headaches Endocrine:  denies unexplained weight changes  Exam BP 121/78 (BP Location: Left Arm, Patient Position: Sitting, Cuff Size: Normal)   Pulse 65   Temp 98.6 F (37 C) (Oral)   Ht 5' 8.5" (1.74 m)   Wt 219 lb 6 oz (99.5 kg)   SpO2 95%   BMI 32.87 kg/m  General:  well developed, well nourished, in no apparent distress Skin:  no significant moles, warts, or growths Head:  no masses, lesions, or tenderness Eyes:  pupils equal and round, sclera anicteric without injection Ears:  canals without lesions, TMs shiny without retraction, no obvious effusion, no erythema Nose:  nares patent, mucosa normal Throat/Pharynx:  lips and gingiva without lesion; tongue and uvula midline; non-inflamed pharynx; no exudates or postnasal drainage Lungs:  clear to auscultation, breath sounds equal bilaterally, no respiratory distress Cardio:  regular rate and rhythm, no LE edema or bruits Rectal: Deferred GI: BS+, S, NT, ND, no masses or organomegaly Musculoskeletal:  symmetrical muscle groups noted without atrophy or deformity Neuro:  gait normal; deep tendon reflexes normal and symmetric Psych: well oriented with normal range of affect and appropriate judgment/insight  Assessment and Plan  Well adult  exam - Plan: CBC, Comprehensive metabolic panel, Lipid panel  Screening for prostate cancer - Plan: PSA  Prediabetes - Plan: Hemoglobin A1c   Well 67 y.o. male. Counseled on diet and exercise. Advanced directive form provided today.  PCV20 today. Shingrix and Tdap rec'd to get at pharmacy.  Other orders as above. Follow up in 6 mo.  The patient voiced understanding and agreement to the plan.  Jilda Roche [place], DO 01/16/23 1:22 PM

## 2023-01-17 LAB — CBC
HCT: 48.6 % (ref 39.0–52.0)
Hemoglobin: 16.1 g/dL (ref 13.0–17.0)
MCHC: 33.2 g/dL (ref 30.0–36.0)
MCV: 95.7 fl (ref 78.0–100.0)
Platelets: 214 10*3/uL (ref 150.0–400.0)
RBC: 5.07 Mil/uL (ref 4.22–5.81)
RDW: 13.2 % (ref 11.5–15.5)
WBC: 10.8 10*3/uL — ABNORMAL HIGH (ref 4.0–10.5)

## 2023-01-17 LAB — COMPREHENSIVE METABOLIC PANEL
ALT: 28 U/L (ref 0–53)
AST: 28 U/L (ref 0–37)
Albumin: 4.6 g/dL (ref 3.5–5.2)
Alkaline Phosphatase: 50 U/L (ref 39–117)
BUN: 19 mg/dL (ref 6–23)
CO2: 30 meq/L (ref 19–32)
Calcium: 9.5 mg/dL (ref 8.4–10.5)
Chloride: 101 meq/L (ref 96–112)
Creatinine, Ser: 1.17 mg/dL (ref 0.40–1.50)
GFR: 65.53 mL/min (ref >60.00)
Glucose, Bld: 71 mg/dL (ref 70–99)
Potassium: 4 meq/L (ref 3.5–5.1)
Sodium: 141 meq/L (ref 135–145)
Total Bilirubin: 1.1 mg/dL (ref 0.2–1.2)
Total Protein: 7.2 g/dL (ref 6.0–8.3)

## 2023-01-17 LAB — LIPID PANEL
Cholesterol: 148 mg/dL (ref 0–200)
HDL: 40 mg/dL (ref >39.00)
LDL Cholesterol: 85 mg/dL (ref 0–99)
NonHDL: 108.04
Total CHOL/HDL Ratio: 4
Triglycerides: 115 mg/dL (ref 0.0–149.0)
VLDL: 23 mg/dL (ref 0.0–40.0)

## 2023-01-17 LAB — HEMOGLOBIN A1C: Hgb A1c MFr Bld: 6 % (ref 4.6–6.5)

## 2023-01-17 LAB — PSA: PSA: 0.8 ng/mL (ref 0.10–4.00)

## 2023-02-10 ENCOUNTER — Other Ambulatory Visit: Payer: Self-pay | Admitting: Family Medicine

## 2023-03-21 ENCOUNTER — Ambulatory Visit: Payer: Self-pay | Admitting: Cardiology

## 2023-03-22 ENCOUNTER — Encounter: Payer: Self-pay | Admitting: Family Medicine

## 2023-04-03 ENCOUNTER — Ambulatory Visit: Payer: PPO | Admitting: Psychology

## 2023-04-06 ENCOUNTER — Ambulatory Visit (INDEPENDENT_AMBULATORY_CARE_PROVIDER_SITE_OTHER): Payer: PPO | Admitting: Psychology

## 2023-04-06 DIAGNOSIS — F4323 Adjustment disorder with mixed anxiety and depressed mood: Secondary | ICD-10-CM | POA: Diagnosis not present

## 2023-04-06 NOTE — Progress Notes (Signed)
Tracy Ridgel, PhD   [facility] Counselor Initial Adult Exam  Name: Tracy Smith Date: 04/06/2023 MRN: [id number] DOB: [birth year] PCP: Tracy Dory, DO  Time Spent: 3:05  pm - 4:00 pm: 55 Minutes   Tracy Smith participated from home, via video, and consented to treatment. Therapist participated from home office. We met online due to COVID pandemic.   Guardian/Payee:  N/A    Paperwork requested: No   Reason for Visit /Presenting Problem: Anxiety and depression related to current life circumstances.  Mental Status Exam: Appearance:   Casual     Behavior:  Appropriate  Motor:  Normal  Speech/Language:   Normal Rate  Affect:  Appropriate  Mood:  depressed  Thought process:  normal  Thought content:    WNL  Sensory/Perceptual disturbances:    WNL  Orientation:  oriented to person, place, and situation  Attention:  Good  Concentration:  Good  Memory:  WNL  Fund of knowledge:   Good  Insight:    Good  Judgment:   Good  Impulse Control:  Good     Reported Symptoms:  Anxiety, sadness, frustration, agitation  Risk Assessment: Danger to Self:  No Self-injurious Behavior: No Danger to Others: No Duty to Warn:no Physical Aggression / Violence:No  Access to Firearms a concern: Yes  Gang Involvement:No  Patient / guardian was educated about steps to take if suicide or homicide risk level increases between visits: n/a While future psychiatric events cannot be accurately predicted, the patient does not currently require acute inpatient psychiatric care and does not currently meet [facility] involuntary commitment criteria.  Substance Abuse History: Current substance abuse: No     Past Psychiatric History:   No previous psychological problems have been observed Outpatient Providers:Tracy Smith, Ph.D. History of Psych Hospitalization: No  Psychological Testing:  N/A    Abuse History:  Victim of: No.,   N/A    Report needed: No. Victim of Neglect:No. Perpetrator of  N/A   Witness / Exposure to Domestic Violence: No   Protective Services Involvement: No  Witness to [employer] Violence:  No   Family History:  Family History  Problem Relation Age of Onset   Heart attack Father    High blood pressure Father    Heart disease Father    Colon cancer Neg Hx    Colon polyps Neg Hx    Esophageal cancer Neg Hx    Rectal cancer Neg Hx    Stomach cancer Neg Hx     Living situation: the patient lives with their spouse  Sexual Orientation: Straight  Relationship Status: married  Name of spouse / other:Tracy Smith If a parent, number of children / ages:two adult biological daughters and one step-daughter.  Support Systems: N/A  Financial Stress:  No   Income/Employment/Disability: Architect: No   Educational History: Education: Risk manager: unknown  Any cultural differences that may affect / interfere with treatment:  not applicable   Recreation/Hobbies: exercise, shooting, cars  Stressors: Other: wife's illness, transition to retirement.     Strengths: Self Advocate  Barriers:  social isolation   Legal History: Pending legal issue / charges: The patient has no significant history of legal issues. History of legal issue / charges:  N/A  Medical History/Surgical History: reviewed Past Medical History:  Diagnosis Date   Allergy    High cholesterol    Hypertension  Past Surgical History:  Procedure Laterality Date   CHOLECYSTECTOMY N/A 11/21/2016   Procedure: LAPAROSCOPIC CHOLECYSTECTOMY;  Surgeon: Tracy Gelinas, MD;  Location: [facility] OR;  Service: General;  Laterality: N/A;   COLONOSCOPY      Medications: Current Outpatient Medications  Medication Sig Dispense Refill   amLODipine-benazepril (LOTREL) 5-40 MG capsule TAKE 1 CAPSULE BY MOUTH IN THE EVENING 90 capsule 3   aspirin EC 81 MG tablet Take 81 mg by mouth  every evening.      famotidine (PEPCID) 20 MG tablet TAKE 1 TABLET BY MOUTH TWICE A DAY 180 tablet 1   montelukast (SINGULAIR) 10 MG tablet TAKE 1 TABLET BY MOUTH EVERY DAY IN THE EVENING 90 tablet 1   rosuvastatin (CRESTOR) 20 MG tablet Take 1 tablet (20 mg total) by mouth daily. 90 tablet 3   No current facility-administered medications for this visit.    Allergies  Allergen Reactions   Bee Venom     swelling   No Known Allergies    Initial Session: Patient was last seen in 2022. He retired from [employer] in May. Wife had been retired for 4 years and he felt the timing was good to stop. Wife was having some cognitive problems and she has been diagnosed with early onset Alzheimer's. He said she is doing well and does "mind exercises" every day and is staying active. She will have more extensive testing in the coming weeks.  Also, Tracy Smith's 89 year old mother, who lives in a retirement facility, woke up and was completely disoriented. They think she had a TIA. She is still able to be independent. He states that he is trying to adjust to retirement. He finds he has "high anxiety" and feels some depression. Tries to stay busy. His hope is to keep wife in the house as long as possible. His oldest daughter from first marriage is getting next Friday. She and boyfriend broke up but then got back together 8-9 months ago. Tracy Smith wanted to talk with the guy, but he hasn't reached out to Tracy Smith. She has not asked Tracy Smith to come to the destination wedding. His other daughter will not talk with him. He says he has depressive feeling with all of the negative things happening in his life. Has few hobbies and few interests. Wife has 1 daughter with 2 kids and Tracy Smith and his wife are involved. Tracy Smith is feeling bad that he is not being respected by family. He feels bad that he lashes out verbally at his wife. He is feeling very lonely and has not developed a social network. His days are not structured and he feels a little lost. The  marital relationship is good, but is not sufficient to be fulfilling. His level of gratification is minimal. He says that he reflects on his life and questions what he has accomplished and is now looking at a future of taking care of his wife and losing his mother. He feels anger about "what the future holds" and is disappointed about what he is facing. This contributes to feeling anxious related to all of the uncertainty. Outlets are working out, house chores, hanging with grandchildren, gun range. These do not fill his time and are mostly individual activities. He does not drink and therefore doesn't go drinking with the guys.  He is seeking counseling to best learn how to navigate these difficult times. He wants strategies to have a more fulfilling and gratifying life.       Goals/Treatment Plan: Patient is seeking  couseling to facilitate adjustment to numerous life changes, including wife's illness, mother's illness, retirement, strained relationships (daughters, brother). Seeking to reduce symptoms of both anxiety and depression. Does not want to consider medication. Will utilize outpatient psychotherapy to explore FOO issues and develop appropriate behavioral strategies. Will also utilize insight oriented therapy. Goal date for resolution is 5-25. Diagnoses:  Adjustment Disorder with Anxiety and Depression.  Plan of Care: Outpatient psychotherapy

## 2023-04-09 ENCOUNTER — Encounter: Payer: Self-pay | Admitting: Cardiology

## 2023-04-09 ENCOUNTER — Ambulatory Visit: Payer: PPO | Attending: Cardiology | Admitting: Cardiology

## 2023-04-09 VITALS — BP 124/80 | HR 97 | Ht 68.5 in | Wt 219.0 lb

## 2023-04-09 DIAGNOSIS — E782 Mixed hyperlipidemia: Secondary | ICD-10-CM

## 2023-04-09 DIAGNOSIS — R931 Abnormal findings on diagnostic imaging of heart and coronary circulation: Secondary | ICD-10-CM

## 2023-04-09 MED ORDER — ROSUVASTATIN CALCIUM 40 MG PO TABS
40.0000 mg | ORAL_TABLET | Freq: Every day | ORAL | 3 refills | Status: DC
Start: 2023-04-09 — End: 2023-09-25

## 2023-04-09 NOTE — Progress Notes (Signed)
  Cardiology Office Note:  .   Date:  04/09/2023  ID:  Tracy Smith, DOB [birth year], MRN [id number] PCP: Sharlene Dory, DO   HeartCare Providers Cardiologist:  Truett Mainland, MD PCP: Sharlene Dory, DO  Chief Complaint  Patient presents with   Hyperlipidemia        Elevated coronary calcium      History of Present Illness: .    Tracy Smith is a 67 y.o. male with hypertension, hyperlipidemia, elevated coronary calcium    Patient is staying active with regular aerobic with returning activity without any reports of chest pain or shortness of breath.  He is compliant with medical therapy.  He has been stressed this morning with his appointment with me, as well as medical visit for his wife.  He attributes rapid heart rate to that, denies any complaints associated with it.  Reviewed lipid panel results with the patient, details below.  Vitals:   04/09/23 0914  BP: 124/80  Pulse: 97  SpO2: 96%     ROS:  Review of Systems  Cardiovascular:  Negative for chest pain, dyspnea on exertion, leg swelling, palpitations and syncope.     Studies Reviewed: Marland Kitchen        EKG 04/09/2023: Sinus tachycardia Left axis deviation When compared with ECG of 17-Nov-2016 08:19, QRS axis Shifted left Questionable change in initial forces of Inferior leads    Independently interpreted Labs 01/16/2023: Chol 148, TG 115, HDL 40, LDL 85 Lipoprotein (a) 38 normal  CT cardiac scoring 09/02/2021: Coronary Calcium Score: Left main: 0 Left anterior descending artery: 472 Left circumflex artery: 72 Right coronary artery: 151   Total: 695 Percentile: 91   Pericardium: Normal. Ascending Aorta: Normal caliber.   Physical Exam:   Physical Exam Vitals and nursing note reviewed.  Constitutional:      General: He is not in acute distress. Neck:     Vascular: No JVD.  Cardiovascular:     Rate and Rhythm: Normal rate and regular rhythm.     Heart sounds: Normal  heart sounds. No murmur heard. Pulmonary:     Effort: Pulmonary effort is normal.     Breath sounds: Normal breath sounds. No wheezing or rales.  Musculoskeletal:     Right lower leg: No edema.     Left lower leg: No edema.      VISIT DIAGNOSES:   ICD-10-CM   1. Elevated coronary artery calcium score  R93.1 EKG 12-Lead    2. Mixed hyperlipidemia  E78.2        ASSESSMENT AND PLAN: .    Tracy Smith is a 67 y.o. male with hypertension, hyperlipidemia, elevated coronary calcium   Elevated coronary calcium: Multivessel calcium 91st percentile. None in LM. Atypical chest pain symptoms. Structurally normal heart, no ischemia (03/2021). Continue Aspirin in absence of bleeding issues. LDL 85.  Goal <55 in light of heavy calcification in coronaries. Increase Crestor to 40 mg, repeat lipid panel in 3 months. If does not tolerate Crestor 40 mg daily, will reduce Crestor back to 20 mg daily, and add Zetia 10 mg daily. If LDL not at goal at repeat lipid panel in 06/2023, will consider adding PCSK9 inhibitor. Discussed heart healthy diet and lifestyle.    Hypertension: Controlled   Mixed hyperlipidemia: As above.         No orders of the defined types were placed in this encounter.    F/u in 6 months  Signed, Elder Negus, MD

## 2023-04-09 NOTE — Patient Instructions (Signed)
Medication Instructions:   INCREASE YOUR CRESTOR TO 40 MG BY MOUTH DAILY  *If you need a refill on your cardiac medications before your next appointment, please call your pharmacy*   Lab Work:  IN 3 MONTHS HERE IN THE OFFICE (AROUND 07/10/23)--CHECK LIPIDS--PLEASE COME FASTING TO THIS LAB APPOINTMENT  If you have labs (blood work) drawn today and your tests are completely normal, you will receive your results only by: MyChart Message (if you have MyChart) OR A paper copy in the mail If you have any lab test that is abnormal or we need to change your treatment, we will call you to review the results.   Follow-Up: At [facility], you and your health needs are our priority.  As part of our continuing mission to provide you with exceptional heart care, we have created designated Provider Care Teams.  These Care Teams include your primary Cardiologist (physician) and Advanced Practice Providers (APPs -  Physician Assistants and Nurse Practitioners) who all work together to provide you with the care you need, when you need it.  We recommend signing up for the patient portal called "MyChart".  Sign up information is provided on this After Visit Summary.  MyChart is used to connect with patients for Virtual Visits (Telemedicine).  Patients are able to view lab/test results, encounter notes, upcoming appointments, etc.  Non-urgent messages can be sent to your provider as well.   To learn more about what you can do with MyChart, go to [email or web address].    Your next appointment:   6 month(s)  Provider:   DR. Rosemary Holms

## 2023-04-17 ENCOUNTER — Ambulatory Visit: Payer: PPO | Admitting: Psychology

## 2023-04-17 DIAGNOSIS — F4323 Adjustment disorder with mixed anxiety and depressed mood: Secondary | ICD-10-CM | POA: Diagnosis not present

## 2023-04-17 NOTE — Progress Notes (Signed)
Tracy Ridgel, PhD   [facility] Behavioral Health Counselor Initial Adult Exam  Name: Tracy Smith Date: 04/17/2023 MRN: [id number] DOB: [birth year] PCP: Sharlene Dory, DO  Time Spent: 3:05  pm - 4:00 pm: 55 Minutes   Tracy Smith participated from home, via video, and consented to treatment. Therapist participated from home office. We met online due to COVID pandemic.   Guardian/Payee:  N/A    Paperwork requested: No   Reason for Visit /Presenting Problem: Anxiety and depression related to current life circumstances.  Mental Status Exam: Appearance:   Casual     Behavior:  Appropriate  Motor:  Normal  Speech/Language:   Normal Rate  Affect:  Appropriate  Mood:  depressed  Thought process:  normal  Thought content:    WNL  Sensory/Perceptual disturbances:    WNL  Orientation:  oriented to person, place, and situation  Attention:  Good  Concentration:  Good  Memory:  WNL  Fund of knowledge:   Good  Insight:    Good  Judgment:   Good  Impulse Control:  Good     Reported Symptoms:  Anxiety, sadness, frustration, agitation  Risk Assessment: Danger to Self:  No Self-injurious Behavior: No Danger to Others: No Duty to Warn:no Physical Aggression / Violence:No  Access to Firearms a concern: Yes  Gang Involvement:No  Patient / guardian was educated about steps to take if suicide or homicide risk level increases between visits: n/a While future psychiatric events cannot be accurately predicted, the patient does not currently require acute inpatient psychiatric care and does not currently meet [facility] involuntary commitment criteria.  Substance Abuse History: Current substance abuse: No     Past Psychiatric History:   No previous psychological problems have been observed Outpatient Providers:Tracy Smith, Ph.D. History of Psych Hospitalization: No  Psychological Testing:  N/A     Abuse History:  Victim of: No.,  N/A    Report needed: No. Victim of Neglect:No. Perpetrator of  N/A   Witness / Exposure to Domestic Violence: No   Protective Services Involvement: No  Witness to [employer] Violence:  No   Family History:  Family History  Problem Relation Age of Onset   Heart attack Father    High blood pressure Father    Heart disease Father    Colon cancer Neg Hx    Colon polyps Neg Hx    Esophageal cancer Neg Hx    Rectal cancer Neg Hx    Stomach cancer Neg Hx     Living situation: the patient lives with their spouse  Sexual Orientation: Straight  Relationship Status: married  Name of spouse / other:Tracy Smith If a parent, number of children / ages:two adult biological daughters and one step-daughter.  Support Systems: N/A  Financial Stress:  No   Income/Employment/Disability: Architect: No   Educational History: Education: Risk manager: unknown  Any cultural differences that may affect / interfere with treatment:  not applicable   Recreation/Hobbies: exercise, shooting, cars  Stressors: Other: wife's illness, transition to retirement.     Strengths: Self Advocate  Barriers:  social isolation   Legal History: Pending legal issue / charges: The patient has no significant history of legal issues. History of legal issue / charges:  N/A  Medical History/Surgical History: reviewed Past Medical History:  Diagnosis  Date   Allergy    High cholesterol    Hypertension     Past Surgical History:  Procedure Laterality Date   CHOLECYSTECTOMY N/A 11/21/2016   Procedure: LAPAROSCOPIC CHOLECYSTECTOMY;  Surgeon: Violeta Gelinas, MD;  [facility]: [facility];  Service: General;  Laterality: N/A;   COLONOSCOPY      Medications: Current Outpatient Medications  Medication Sig Dispense Refill   amLODipine-benazepril (LOTREL) 5-40 MG capsule TAKE 1 CAPSULE BY MOUTH IN THE EVENING 90 capsule 3   aspirin  EC 81 MG tablet Take 81 mg by mouth every evening.      famotidine (PEPCID) 20 MG tablet TAKE 1 TABLET BY MOUTH TWICE A DAY 180 tablet 1   montelukast (SINGULAIR) 10 MG tablet TAKE 1 TABLET BY MOUTH EVERY DAY IN THE EVENING 90 tablet 1   rosuvastatin (CRESTOR) 40 MG tablet Take 1 tablet (40 mg total) by mouth daily. 90 tablet 3   No current facility-administered medications for this visit.    Allergies  Allergen Reactions   Bee Venom     swelling   No Known Allergies    Initial Session: Patient was last seen in 2022. He retired from [employer] in May. Wife had been retired for 4 years and he felt the timing was good to stop. Wife was having some cognitive problems and she has been diagnosed with early onset Alzheimer's. He said she is doing well and does "mind exercises" every day and is staying active. She will have more extensive testing in the coming weeks.  Also, Tracy Smith's 67 year old mother, who lives in a retirement facility, woke up and was completely disoriented. They think she had a TIA. She is still able to be independent. He states that he is trying to adjust to retirement. He finds he has "high anxiety" and feels some depression. Tries to stay busy. His hope is to keep wife in the house as long as possible. His oldest daughter from first marriage is getting next Friday. She and boyfriend broke up but then got back together 8-9 months ago. Tracy Smith wanted to talk with the guy, but he hasn't reached out to [place]. She has not asked Tracy Smith to come to the destination wedding. His other daughter will not talk with him. He says he has depressive feeling with all of the negative things happening in his life. Has few hobbies and few interests. Wife has 1 daughter with 2 kids and Tracy Smith and his wife are involved. Tracy Smith is feeling bad that he is not being respected by family. He feels bad that he lashes out verbally at his wife. He is feeling very lonely and has not developed a social network. His days are not  structured and he feels a little lost. The marital relationship is good, but is not sufficient to be fulfilling. His level of gratification is minimal. He says that he reflects on his life and questions what he has accomplished and is now looking at a future of taking care of his wife and losing his mother. He feels anger about "what the future holds" and is disappointed about what he is facing. This contributes to feeling anxious related to all of the uncertainty. Outlets are working out, house chores, hanging with grandchildren, gun range. These do not fill his time and are mostly individual activities. He does not drink and therefore doesn't go drinking with the guys.  He is seeking counseling to best learn how to navigate these difficult times. He wants strategies to have  a more fulfilling and gratifying life.        Goals/Treatment Plan: Patient is seeking couseling to facilitate adjustment to numerous life changes, including wife's illness, mother's illness, retirement, strained relationships (daughters, brother). Seeking to reduce symptoms of both anxiety and depression. Does not want to consider medication. Will utilize outpatient psychotherapy to explore FOO issues and develop appropriate behavioral strategies. Will also utilize insight oriented therapy. Goal date for resolution is 5-25. Diagnoses:  Adjustment Disorder with Anxiety and Depression.  Plan of Care: Outpatient psychotherapy  Session notes: He states that the holiday went well. Wife had her neuropsych. test last week. Both he and wife are anxious about the results, which should be in next week. He feels an obligation to do whatever he can to care for his wife. His daughter Morrie Sheldon got married last weekend. He texted her congratulations and she has not responded to him. A few months ago he requested to speak to her fiance and it never happened. Wife is expressing high anxiety about her memory. Wakes up crying that she fears she will soon  lose her memories. He thinks wife is anticipating the worst. Will discuss the test results at next session and determine best ways to respond, be supportive and still exercise self-care.

## 2023-05-15 ENCOUNTER — Ambulatory Visit: Payer: PPO | Admitting: Psychology

## 2023-05-15 NOTE — Progress Notes (Unsigned)
 Tracy ALM KERNS, PhD   [facility] Behavioral Health Counselor Initial Adult Exam  Name: Tracy Smith Date: 05/15/2023 MRN: [id number] DOB: [birth year] PCP: Tracy Mabel Mt, DO  Time Spent: 3:05  pm - 4:00 pm: 55 Minutes   Tracy Smith participated from home, via video, and consented to treatment. Therapist participated from home office. We met online due to COVID pandemic.   Guardian/Payee:  N/A    Paperwork requested: No   Reason for Visit /Presenting Problem: Anxiety and depression related to current life circumstances.  Mental Status Exam: Appearance:   Casual     Behavior:  Appropriate  Motor:  Normal  Speech/Language:   Normal Rate  Affect:  Appropriate  Mood:  depressed  Thought process:  normal  Thought content:    WNL  Sensory/Perceptual disturbances:    WNL  Orientation:  oriented to person, place, and situation  Attention:  Good  Concentration:  Good  Memory:  WNL  Fund of knowledge:   Good  Insight:    Good  Judgment:   Good  Impulse Control:  Good     Reported Symptoms:  Anxiety, sadness, frustration, agitation  Risk Assessment: Danger to Self:  No Self-injurious Behavior: No Danger to Others: No Duty to Warn:no Physical Aggression / Violence:No  Access to Firearms a concern: Yes  Gang Involvement:No  Patient / guardian was educated about steps to take if suicide or homicide risk level increases between visits: n/a While future psychiatric events cannot be accurately predicted, the patient does not currently require acute inpatient psychiatric care and does not currently meet Tracy Smith  involuntary commitment criteria.  Substance Abuse History: Current substance abuse: No     Past Psychiatric History:   No previous psychological problems have been observed Outpatient Providers:Tracy Smith, Ph.D. History of Psych Hospitalization: No   Psychological Testing:  N/A    Abuse History:  Victim of: No.,  N/A    Report needed: No. Victim of Neglect:No. Perpetrator of  N/A   Witness / Exposure to Domestic Violence: No   Protective Services Involvement: No  Witness to Tracy Smith Violence:  No   Family History:  Family History  Problem Relation Age of Onset   Heart attack Father    High blood pressure Father    Heart disease Father    Colon cancer Neg Hx    Colon polyps Neg Hx    Esophageal cancer Neg Hx    Rectal cancer Neg Hx    Stomach cancer Neg Hx     Living situation: the patient lives with their spouse  Sexual Orientation: Straight  Relationship Status: married  Name of spouse / other:Tracy Smith If a parent, number of children / ages:two adult biological daughters and one step-daughter.  Support Systems: N/A  Financial Stress:  No   Income/Employment/Disability: Architect: No   Educational History: Education: Risk Manager: unknown  Any cultural differences that may affect / interfere with treatment:  not applicable   Recreation/Hobbies: exercise, shooting, cars  Stressors: Other: wife's illness, transition to retirement.     Strengths: Self Advocate  Barriers:  social isolation   Legal History: Pending legal issue / charges: The patient has no significant history of legal issues. History of legal  issue / charges:  N/A  Medical History/Surgical History: reviewed Past Medical History:  Diagnosis Date   Allergy    High cholesterol    Hypertension     Past Surgical History:  Procedure Laterality Date   CHOLECYSTECTOMY N/A 11/21/2016   Procedure: LAPAROSCOPIC CHOLECYSTECTOMY;  Surgeon: Tracy Moles, MD;  Location: [facility] OR;  Service: General;  Laterality: N/A;   COLONOSCOPY      Medications: Current Outpatient Medications  Medication Sig Dispense Refill   amLODipine -benazepril  (LOTREL) 5-40 MG capsule TAKE 1 CAPSULE BY MOUTH IN THE  EVENING 90 capsule 3   aspirin  EC 81 MG tablet Take 81 mg by mouth every evening.      famotidine  (PEPCID ) 20 MG tablet TAKE 1 TABLET BY MOUTH TWICE A DAY 180 tablet 1   montelukast  (SINGULAIR ) 10 MG tablet TAKE 1 TABLET BY MOUTH EVERY DAY IN THE EVENING 90 tablet 1   rosuvastatin  (CRESTOR ) 40 MG tablet Take 1 tablet (40 mg total) by mouth daily. 90 tablet 3   No current facility-administered medications for this visit.    Allergies  Allergen Reactions   Bee Venom     swelling   No Known Allergies    Initial Session: Patient was last seen in 2022. He retired from [employer] in May. Wife had been retired for 4 years and he felt the timing was good to stop. Wife was having some cognitive problems and she has been diagnosed with early onset Alzheimer's. He said she is doing well and does mind exercises every day and is staying active. She will have more extensive testing in the coming weeks.  Also, Tracy Smith's 4 year old mother, who lives in a retirement facility, woke up and was completely disoriented. They think she had a TIA. She is still able to be independent. He states that he is trying to adjust to retirement. He finds he has high anxiety and feels some depression. Tries to stay busy. His hope is to keep wife in the house as long as possible. His oldest daughter from first marriage is getting next Friday. She and boyfriend broke up but then got back together 8-9 months ago. Tracy Smith wanted to talk with the guy, but he hasn't reached out to Tracy Smith. She has not asked Tracy Smith to come to the destination wedding. His other daughter will not talk with him. He says he has depressive feeling with all of the negative things happening in his life. Has few hobbies and few interests. Wife has 1 daughter with 2 kids and Tracy Smith and his wife are involved. Tracy Smith is feeling bad that he is not being respected by family. He feels bad that he lashes out verbally at his wife. He is feeling very lonely and has not developed a social  network. His days are not structured and he feels a little lost. The marital relationship is good, but is not sufficient to be fulfilling. His level of gratification is minimal. He says that he reflects on his life and questions what he has accomplished and is now looking at a future of taking care of his wife and losing his mother. He feels anger about what the future holds and is disappointed about what he is facing. This contributes to feeling anxious related to all of the uncertainty. Outlets are working out, house chores, hanging with grandchildren, gun range. These do not fill his time and are mostly individual activities. He does not drink and therefore doesn't go drinking with the guys.  He is seeking  counseling to best learn how to navigate these difficult times. He wants strategies to have a more fulfilling and gratifying life.        Goals/Treatment Plan: Patient is seeking couseling to facilitate adjustment to numerous life changes, including wife's illness, mother's illness, retirement, strained relationships (daughters, brother). Seeking to reduce symptoms of both anxiety and depression. Does not want to consider medication. Will utilize outpatient psychotherapy to explore FOO issues and develop appropriate behavioral strategies. Will also utilize insight oriented therapy. Goal date for resolution is 5-25. Diagnoses:  Adjustment Disorder with Anxiety and Depression.  Plan of Care: Outpatient psychotherapy  Session notes: He states that the holiday went well. Wife had her neuropsych. test last week. Both he and wife are anxious about the results, which should be in next week. He feels an obligation to do whatever he can to care for his wife. His daughter Rosina got married last weekend. He texted her congratulations and she has not responded to him. A few months ago he requested to speak to her fiance and it never happened. Wife is expressing high anxiety about her memory. Wakes up crying  that she fears she will soon lose her memories. He thinks wife is anticipating the worst. Will discuss the test results at next session and determine best ways to respond, be supportive and still exercise self-care.

## 2023-05-21 ENCOUNTER — Ambulatory Visit: Payer: Medicare Other | Admitting: Psychology

## 2023-05-21 DIAGNOSIS — F4323 Adjustment disorder with mixed anxiety and depressed mood: Secondary | ICD-10-CM

## 2023-05-21 NOTE — Progress Notes (Signed)
 Tracy ALM KERNS, PhD   [name] Initial Adult Exam  Name: Tracy Smith Date: 05/21/2023 MRN: [id number] DOB: 11-10-1955 PCP: Frann Mabel Mt, DO  Time Spent: 3:05  pm - 4:00 pm: 55 Minutes   [name] participated from home, via video, and consented to treatment. Therapist participated from home office. We met online due to COVID pandemic.   Guardian/Payee:  N/A    Paperwork requested: No   Reason for Visit /Presenting Problem: Anxiety and depression related to current life circumstances.  Mental Status Exam: Appearance:   Casual     Behavior:  Appropriate  Motor:  Normal  Speech/Language:   Normal Rate  Affect:  Appropriate  Mood:  depressed  Thought process:  normal  Thought content:    WNL  Sensory/Perceptual disturbances:    WNL  Orientation:  oriented to person, place, and situation  Attention:  Good  Concentration:  Good  Memory:  WNL  Fund of knowledge:   Good  Insight:    Good  Judgment:   Good  Impulse Control:  Good     Reported Symptoms:  Anxiety, sadness, frustration, agitation  Risk Assessment: Danger to Self:  No Self-injurious Behavior: No Danger to Others: No Duty to Warn:no Physical Aggression / Violence:No  Access to Firearms a concern: Yes  Gang Involvement:No  Patient / guardian was educated about steps to take if suicide or homicide risk level increases between visits: n/a While future psychiatric events cannot be accurately predicted, the patient does not currently require acute inpatient psychiatric care and does not currently meet [facility]  involuntary commitment criteria.  Substance Abuse History: Current substance abuse: No     Past Psychiatric History:   No previous psychological problems have been observed Outpatient Providers:Calea Hribar, Ph.D. History of Psych  Hospitalization: No  Psychological Testing:  N/A    Abuse History:  Victim of: No.,  N/A    Report needed: No. Victim of Neglect:No. Perpetrator of  N/A   Witness / Exposure to Domestic Violence: No   Protective Services Involvement: No  Witness to [employer] Violence:  No   Family History:  Family History  Problem Relation Age of Onset   Heart attack Father    High blood pressure Father    Heart disease Father    Colon cancer Neg Hx    Colon polyps Neg Hx    Esophageal cancer Neg Hx    Rectal cancer Neg Hx    Stomach cancer Neg Hx     Living situation: the patient lives with their spouse  Sexual Orientation: Straight  Relationship Status: married  Name of spouse / other:[name] If a parent, number of children / ages:two adult biological daughters and one step-daughter.  Support Systems: N/A  Financial Stress:  No   Income/Employment/Disability: Architect: No   Educational History: Education: Risk Manager: unknown  Any cultural differences that may affect / interfere with treatment:  not applicable   Recreation/Hobbies: exercise, shooting, cars  Stressors: Other: wife's illness, transition to retirement.     Strengths: Self Advocate  Barriers:  social isolation   Legal History: Pending legal  issue / charges: The patient has no significant history of legal issues. History of legal issue / charges:  N/A  Medical History/Surgical History: reviewed Past Medical History:  Diagnosis Date   Allergy    High cholesterol    Hypertension     Past Surgical History:  Procedure Laterality Date   CHOLECYSTECTOMY N/A 11/21/2016   Procedure: LAPAROSCOPIC CHOLECYSTECTOMY;  Surgeon: Sebastian Moles, MD;  [facility]: [facility] OR;  Service: General;  Laterality: N/A;   COLONOSCOPY      Medications: Current Outpatient Medications  Medication Sig Dispense Refill   amLODipine -benazepril  (LOTREL) 5-40 MG capsule TAKE 1  CAPSULE BY MOUTH IN THE EVENING 90 capsule 3   aspirin  EC 81 MG tablet Take 81 mg by mouth every evening.      famotidine  (PEPCID ) 20 MG tablet TAKE 1 TABLET BY MOUTH TWICE A DAY 180 tablet 1   montelukast  (SINGULAIR ) 10 MG tablet TAKE 1 TABLET BY MOUTH EVERY DAY IN THE EVENING 90 tablet 1   rosuvastatin  (CRESTOR ) 40 MG tablet Take 1 tablet (40 mg total) by mouth daily. 90 tablet 3   No current facility-administered medications for this visit.    Allergies  Allergen Reactions   Bee Venom     swelling   No Known Allergies    Initial Session: Patient was last seen in 2022. He retired from [employer] in May. Wife had been retired for 4 years and he felt the timing was good to stop. Wife was having some cognitive problems and she has been diagnosed with early onset Alzheimer's. He said she is doing well and does mind exercises every day and is staying active. She will have more extensive testing in the coming weeks.  Also, Tilton's 25 year old mother, who lives in a retirement facility, woke up and was completely disoriented. They think she had a TIA. She is still able to be independent. He states that he is trying to adjust to retirement. He finds he has high anxiety and feels some depression. Tries to stay busy. His hope is to keep wife in the house as long as possible. His oldest daughter from first marriage is getting next Friday. She and boyfriend broke up but then got back together 8-9 months ago. Jovann wanted to talk with the guy, but he hasn't reached out to [place]. She has not asked Olusegun to come to the destination wedding. His other daughter will not talk with him. He says he has depressive feeling with all of the negative things happening in his life. Has few hobbies and few interests. Wife has 1 daughter with 2 kids and Jhovani and his wife are involved. Tanor is feeling bad that he is not being respected by family. He feels bad that he lashes out verbally at his wife. He is feeling very lonely and has  not developed a social network. His days are not structured and he feels a little lost. The marital relationship is good, but is not sufficient to be fulfilling. His level of gratification is minimal. He says that he reflects on his life and questions what he has accomplished and is now looking at a future of taking care of his wife and losing his mother. He feels anger about what the future holds and is disappointed about what he is facing. This contributes to feeling anxious related to all of the uncertainty. Outlets are working out, house chores, hanging with grandchildren, gun range. These do not fill his time and are mostly individual activities. He  does not drink and therefore doesn't go drinking with the guys.  He is seeking counseling to best learn how to navigate these difficult times. He wants strategies to have a more fulfilling and gratifying life.        Goals/Treatment Plan: Patient is seeking couseling to facilitate adjustment to numerous life changes, including wife's illness, mother's illness, retirement, strained relationships (daughters, brother). Seeking to reduce symptoms of both anxiety and depression. Does not want to consider medication. Will utilize outpatient psychotherapy to explore FOO issues and develop appropriate behavioral strategies. Will also utilize insight oriented therapy. Goal date for resolution is 5-25. Diagnoses:  Adjustment Disorder with Anxiety and Depression.  Plan of Care: Outpatient psychotherapy  Session notes: He states that the holidays went well and everyone got along well. He says he did not hear from his kids at all even though he reached out to them to wish them a happy holiday. Wife has early onset Alzheimer's, that manifests with word fluency at this point. Learning strategies to manage her conditions. He gets frustrated with her and needs to get better control over his reactions to her. He says he needs to learn to be more patient. He wants to  prioritize their lives and make their life as gratifying as possible. His kid's unwillingness to reach out to him to have a relationship is upsetting and frustrating to [place]. He feels his ex-wife's influence was negative. One of his daughter's is upset that he didn't attend her wedding. He says that his wife is his first priority and then he will address the issues/conflicts with his daughters. He does feel that he is finally starting to settle into a routine. He does need to take care of some of his medical issues. He has build-up in cardiac artery and may need a cath in the future. We talked about what to say to daughters. He will reach out to each of his daughters to check in and share his desire to re-establish a relationship. He says that without his work stress, he has been able to have greater patience and keep moods steady.         Tracy ALM KERNS, PhD 5:10p-6:00p 50 minutes

## 2023-05-28 ENCOUNTER — Ambulatory Visit: Payer: Self-pay | Admitting: Psychology

## 2023-06-04 ENCOUNTER — Ambulatory Visit: Payer: PPO | Admitting: Psychology

## 2023-06-04 DIAGNOSIS — F4323 Adjustment disorder with mixed anxiety and depressed mood: Secondary | ICD-10-CM | POA: Diagnosis not present

## 2023-06-04 NOTE — Progress Notes (Addendum)
Garrel Ridgel, PhD   [facility] Counselor Initial Adult Exam  Name: Tracy Smith Date: 06/04/2023 MRN: [id number] DOB: 1956-02-22 PCP: Sharlene Dory, DO     Tracy Smith participated from home, via video, and consented to treatment. Therapist participated from home office. We met online due to COVID pandemic.   Guardian/Payee:  N/A    Paperwork requested: No   Reason for Visit /Presenting Problem: Anxiety and depression related to current life circumstances.  Mental Status Exam: Appearance:   Casual     Behavior:  Appropriate  Motor:  Normal  Speech/Language:   Normal Rate  Affect:  Appropriate  Mood:  depressed  Thought process:  normal  Thought content:    WNL  Sensory/Perceptual disturbances:    WNL  Orientation:  oriented to person, place, and situation  Attention:  Good  Concentration:  Good  Memory:  WNL  Fund of knowledge:   Good  Insight:    Good  Judgment:   Good  Impulse Control:  Good     Reported Symptoms:  Anxiety, sadness, frustration, agitation  Risk Assessment: Danger to Self:  No Self-injurious Behavior: No Danger to Others: No Duty to Warn:no Physical Aggression / Violence:No  Access to Firearms a concern: Yes  Gang Involvement:No  Patient / guardian was educated about steps to take if suicide or homicide risk level increases between visits: n/a While future psychiatric events cannot be accurately predicted, the patient does not currently require acute inpatient psychiatric care and does not currently meet [facility] involuntary commitment criteria.  Substance Abuse History: Current substance abuse: No     Past Psychiatric History:   No previous psychological problems have been observed Outpatient Providers:Rondia Higginbotham, Ph.D. History of Psych  Hospitalization: No  Psychological Testing:  N/A    Abuse History:  Victim of: No.,  N/A    Report needed: No. Victim of Neglect:No. Perpetrator of  N/A   Witness / Exposure to Domestic Violence: No   Protective Services Involvement: No  Witness to [employer] Violence:  No   Family History:  Family History  Problem Relation Age of Onset   Heart attack Father    High blood pressure Father    Heart disease Father    Colon cancer Neg Hx    Colon polyps Neg Hx    Esophageal cancer Neg Hx    Rectal cancer Neg Hx    Stomach cancer Neg Hx     Living situation: the patient lives with their spouse  Sexual Orientation: Straight  Relationship Status: married  Name of spouse / other:Tracy Smith If a parent, number of children / ages:two adult biological daughters and one step-daughter.  Support Systems: N/A  Financial Stress:  No   Income/Employment/Disability: Architect: No   Educational History: Education: Risk manager: unknown  Any cultural differences that may affect / interfere with treatment:  not applicable   Recreation/Hobbies: exercise, shooting, cars  Stressors: Other: wife's illness, transition to retirement.     Strengths: Self Advocate  Barriers:  social isolation  Legal History: Pending legal issue / charges: The patient has no significant history of legal issues. History of legal issue / charges:  N/A  Medical History/Surgical History: reviewed Past Medical History:  Diagnosis Date   Allergy    High cholesterol    Hypertension     Past Surgical History:  Procedure Laterality Date   CHOLECYSTECTOMY N/A 11/21/2016   Procedure: LAPAROSCOPIC CHOLECYSTECTOMY;  Surgeon: Violeta Gelinas, MD;  [facility];  Service: General;  Laterality: N/A;   COLONOSCOPY      Medications: Current Outpatient Medications  Medication Sig Dispense Refill   amLODipine-benazepril (LOTREL) 5-40 MG capsule TAKE 1  CAPSULE BY MOUTH IN THE EVENING 90 capsule 3   aspirin EC 81 MG tablet Take 81 mg by mouth every evening.      famotidine (PEPCID) 20 MG tablet TAKE 1 TABLET BY MOUTH TWICE A DAY 180 tablet 1   montelukast (SINGULAIR) 10 MG tablet TAKE 1 TABLET BY MOUTH EVERY DAY IN THE EVENING 90 tablet 1   rosuvastatin (CRESTOR) 40 MG tablet Take 1 tablet (40 mg total) by mouth daily. 90 tablet 3   No current facility-administered medications for this visit.    Allergies  Allergen Reactions   Bee Venom     swelling   No Known Allergies    Initial Session: Patient was last seen in 2022. He retired from [employer] in May. Wife had been retired for 4 years and he felt the timing was good to stop. Wife was having some cognitive problems and she has been diagnosed with early onset Alzheimer's. He said she is doing well and does "mind exercises" every day and is staying active. She will have more extensive testing in the coming weeks.  Also, Tracy Smith's 54 year old mother, who lives in a retirement facility, woke up and was completely disoriented. They think she had a TIA. She is still able to be independent. He states that he is trying to adjust to retirement. He finds he has "high anxiety" and feels some depression. Tries to stay busy. His hope is to keep wife in the house as long as possible. His oldest daughter from first marriage is getting next Friday. She and boyfriend broke up but then got back together 8-9 months ago. Tracy Smith wanted to talk with the guy, but he hasn't reached out to [place]. She has not asked Tracy Smith to come to the destination wedding. His other daughter will not talk with him. He says he has depressive feeling with all of the negative things happening in his life. Has few hobbies and few interests. Wife has 1 daughter with 2 kids and Tracy Smith and his wife are involved. Fin is feeling bad that he is not being respected by family. He feels bad that he lashes out verbally at his wife. He is feeling very lonely and has  not developed a social network. His days are not structured and he feels a little lost. The marital relationship is good, but is not sufficient to be fulfilling. His level of gratification is minimal. He says that he reflects on his life and questions what he has accomplished and is now looking at a future of taking care of his wife and losing his mother. He feels anger about "what the future holds" and is disappointed about what he is facing. This contributes to feeling anxious related to all of the uncertainty. Outlets are working out, house chores, hanging with grandchildren, gun range. These do not fill his time and are  mostly individual activities. He does not drink and therefore doesn't go drinking with the guys.  He is seeking counseling to best learn how to navigate these difficult times. He wants strategies to have a more fulfilling and gratifying life.        Goals/Treatment Plan: Patient is seeking couseling to facilitate adjustment to numerous life changes, including wife's illness, mother's illness, retirement, strained relationships (daughters, brother). Seeking to reduce symptoms of both anxiety and depression. Does not want to consider medication. Will utilize outpatient psychotherapy to explore FOO issues and develop appropriate behavioral strategies. Will also utilize insight oriented therapy. Goal date for resolution is 5-25. Diagnoses:  Adjustment Disorder with Anxiety and Depression.  Plan of Care: Outpatient psychotherapy  Session notes: Tracy Smith says he got all of his legal issues addressed and things are settled. He says they are still waiting to get wife's approval for the medication approved by FDA for dementia. He states taking care of himself. Mother is not doing well. She is 89 and deteriorating. He talked about the lack of relationships with daughters. He still sends weekly texts hoping that one day they will respond. He has been working to create a retirement structure and says it  is going well. States that his anxiety is more manageable lately.              Garrel Ridgel, PhD 11:40a-12:30p 50 minutes

## 2023-06-11 ENCOUNTER — Telehealth: Payer: Self-pay | Admitting: Family Medicine

## 2023-06-11 NOTE — Telephone Encounter (Signed)
Pt's spouse dropped off copy of Power of Attorney for provider to see and have on pt's chart. Document put at front office tray under providers name.

## 2023-06-12 NOTE — Telephone Encounter (Signed)
Paperwork placed in bin for provider to review.

## 2023-06-18 ENCOUNTER — Ambulatory Visit: Payer: PPO | Admitting: Psychology

## 2023-06-18 DIAGNOSIS — F4323 Adjustment disorder with mixed anxiety and depressed mood: Secondary | ICD-10-CM | POA: Diagnosis not present

## 2023-06-18 NOTE — Progress Notes (Addendum)
Garrel Ridgel, PhD   [facility] Behavioral Health Counselor Initial Adult Exam  Name: Tracy Smith Date: 06/18/2023 MRN: [id number] DOB: 1956-02-03 PCP: Sharlene Dory, DO    [name] participated from home, via video, and consented to treatment. Therapist participated from home office. We met online due to COVID pandemic.   Guardian/Payee:  N/A    Paperwork requested: No   Reason for Visit /Presenting Problem: Anxiety and depression related to current life circumstances.  Mental Status Exam: Appearance:   Casual     Behavior:  Appropriate  Motor:  Normal  Speech/Language:   Normal Rate  Affect:  Appropriate  Mood:  depressed  Thought process:  normal  Thought content:    WNL  Sensory/Perceptual disturbances:    WNL  Orientation:  oriented to person, place, and situation  Attention:  Good  Concentration:  Good  Memory:  WNL  Fund of knowledge:   Good  Insight:    Good  Judgment:   Good  Impulse Control:  Good     Reported Symptoms:  Anxiety, sadness, frustration, agitation  Risk Assessment: Danger to Self:  No Self-injurious Behavior: No Danger to Others: No Duty to Warn:no Physical Aggression / Violence:No  Access to Firearms a concern: Yes  Gang Involvement:No  Patient / guardian was educated about steps to take if suicide or homicide risk level increases between visits: n/a While future psychiatric events cannot be accurately predicted, the patient does not currently require acute inpatient psychiatric care and does not currently meet [facility] involuntary commitment criteria.  Substance Abuse History: Current substance abuse: No     Past Psychiatric History:   No previous psychological problems have been observed Outpatient Providers:Jiraiya Mcewan,  Ph.D. History of Psych Hospitalization: No  Psychological Testing:  N/A    Abuse History:  Victim of: No.,  N/A    Report needed: No. Victim of Neglect:No. Perpetrator of  N/A   Witness / Exposure to Domestic Violence: No   Protective Services Involvement: No  Witness to [employer] Violence:  No   Family History:  Family History  Problem Relation Age of Onset   Heart attack Father    High blood pressure Father    Heart disease Father    Colon cancer Neg Hx    Colon polyps Neg Hx    Esophageal cancer Neg Hx    Rectal cancer Neg Hx    Stomach cancer Neg Hx     Living situation: the patient lives with their spouse  Sexual Orientation: Straight  Relationship Status: married  Name of spouse / other:[name] If a parent, number of children / ages:two adult biological daughters and one step-daughter.  Support Systems: N/A  Financial Stress:  No   Income/Employment/Disability: Architect: No   Educational History: Education: Risk manager: unknown  Any cultural differences that may affect / interfere with treatment:  not applicable   Recreation/Hobbies: exercise, shooting, cars  Stressors: Other: wife's illness, transition  to retirement.     Strengths: Self Advocate  Barriers:  social isolation   Legal History: Pending legal issue / charges: The patient has no significant history of legal issues. History of legal issue / charges:  N/A  Medical History/Surgical History: reviewed Past Medical History:  Diagnosis Date   Allergy    High cholesterol    Hypertension     Past Surgical History:  Procedure Laterality Date   CHOLECYSTECTOMY N/A 11/21/2016   Procedure: LAPAROSCOPIC CHOLECYSTECTOMY;  Surgeon: Violeta Gelinas, MD;  [facility]: [facility] OR;  Service: General;  Laterality: N/A;   COLONOSCOPY      Medications: Current Outpatient Medications  Medication Sig Dispense Refill   amLODipine-benazepril (LOTREL) 5-40  MG capsule TAKE 1 CAPSULE BY MOUTH IN THE EVENING 90 capsule 3   aspirin EC 81 MG tablet Take 81 mg by mouth every evening.      famotidine (PEPCID) 20 MG tablet TAKE 1 TABLET BY MOUTH TWICE A DAY 180 tablet 1   montelukast (SINGULAIR) 10 MG tablet TAKE 1 TABLET BY MOUTH EVERY DAY IN THE EVENING 90 tablet 1   rosuvastatin (CRESTOR) 40 MG tablet Take 1 tablet (40 mg total) by mouth daily. 90 tablet 3   No current facility-administered medications for this visit.    Allergies  Allergen Reactions   Bee Venom     swelling   No Known Allergies    Initial Session: Patient was last seen in 2022. He retired from [employer] in May. Wife had been retired for 4 years and he felt the timing was good to stop. Wife was having some cognitive problems and she has been diagnosed with early onset Alzheimer's. He said she is doing well and does "mind exercises" every day and is staying active. She will have more extensive testing in the coming weeks.  Also, Almir's 46 year old mother, who lives in a retirement facility, woke up and was completely disoriented. They think she had a TIA. She is still able to be independent. He states that he is trying to adjust to retirement. He finds he has "high anxiety" and feels some depression. Tries to stay busy. His hope is to keep wife in the house as long as possible. His oldest daughter from first marriage is getting next Friday. She and boyfriend broke up but then got back together 8-9 months ago. Mance wanted to talk with the guy, but he hasn't reached out to [place]. She has not asked Hartley to come to the destination wedding. His other daughter will not talk with him. He says he has depressive feeling with all of the negative things happening in his life. Has few hobbies and few interests. Wife has 1 daughter with 2 kids and Kimmie and his wife are involved. Romolo is feeling bad that he is not being respected by family. He feels bad that he lashes out verbally at his wife. He is feeling  very lonely and has not developed a social network. His days are not structured and he feels a little lost. The marital relationship is good, but is not sufficient to be fulfilling. His level of gratification is minimal. He says that he reflects on his life and questions what he has accomplished and is now looking at a future of taking care of his wife and losing his mother. He feels anger about "what the future holds" and is disappointed about what he is facing. This contributes to feeling anxious related to all of the uncertainty. Outlets are working  out, house chores, hanging with grandchildren, gun range. These do not fill his time and are mostly individual activities. He does not drink and therefore doesn't go drinking with the guys.  He is seeking counseling to best learn how to navigate these difficult times. He wants strategies to have a more fulfilling and gratifying life.        Goals/Treatment Plan: Patient is seeking couseling to facilitate adjustment to numerous life changes, including wife's illness, mother's illness, retirement, strained relationships (daughters, brother). Seeking to reduce symptoms of both anxiety and depression. Does not want to consider medication. Will utilize outpatient psychotherapy to explore FOO issues and develop appropriate behavioral strategies. Will also utilize insight oriented therapy. Goal date for resolution is 5-25. Diagnoses:  Adjustment Disorder with Anxiety and Depression.  Plan of Care: Outpatient psychotherapy  Session notes: Arris says that his (almost) 66 year old mother had a fall. Did not go to hospital and his brother tells him "she is okay". Her dementia is worsening. He says his brother is a narcissist and it is difficult to listen to him. He is working with his wife to take good care of her. The afternoons for his wife are the worst and when she is most symptomatic. We talked about the need for self-care and how that is achieved. He needs to  "re-charge" periodically in order to help his moods remain steady and avoid "blow-ups". Frustration tolerance can be low when his is overly stressed and/or fatigued. Will continue to structure his time each day, which is sometimes a challenge.                 Garrel Ridgel, PhD 8:40a-9:30p 50 minutes

## 2023-07-01 ENCOUNTER — Other Ambulatory Visit: Payer: Self-pay | Admitting: Family Medicine

## 2023-07-02 ENCOUNTER — Ambulatory Visit: Payer: PPO | Admitting: Psychology

## 2023-07-02 DIAGNOSIS — F4323 Adjustment disorder with mixed anxiety and depressed mood: Secondary | ICD-10-CM

## 2023-07-02 NOTE — Progress Notes (Addendum)
 Garrel Ridgel, PhD   [facility] Behavioral Health Counselor Initial Adult Exam  Name: Isais Klipfel Date: 07/02/2023 MRN: [id number] DOB: 12-08-1955 PCP: Sharlene Dory, DO     [name] participated from home, via video, and consented to treatment. Therapist participated from home office. We met online due to COVID pandemic.   Guardian/Payee:  N/A    Paperwork requested: No   Reason for Visit /Presenting Problem: Anxiety and depression related to current life circumstances.  Mental Status Exam: Appearance:   Casual     Behavior:  Appropriate  Motor:  Normal  Speech/Language:   Normal Rate  Affect:  Appropriate  Mood:  depressed  Thought process:  normal  Thought content:    WNL  Sensory/Perceptual disturbances:    WNL  Orientation:  oriented to person, place, and situation  Attention:  Good  Concentration:  Good  Memory:  WNL  Fund of knowledge:   Good  Insight:    Good  Judgment:   Good  Impulse Control:  Good     Reported Symptoms:  Anxiety, sadness, frustration, agitation  Risk Assessment: Danger to Self:  No Self-injurious Behavior: No Danger to Others: No Duty to Warn:no Physical Aggression / Violence:No  Access to Firearms a concern: Yes  Gang Involvement:No  Patient / guardian was educated about steps to take if suicide or homicide risk level increases between visits: n/a While future psychiatric events cannot be accurately predicted, the patient does not currently require acute inpatient psychiatric care and does not currently meet [facility] involuntary commitment criteria.  Substance Abuse History: Current substance abuse: No     Past Psychiatric History:   No previous psychological problems have been observed Outpatient  Providers:Mahlon Gabrielle, Ph.D. History of Psych Hospitalization: No  Psychological Testing:  N/A    Abuse History:  Victim of: No.,  N/A    Report needed: No. Victim of Neglect:No. Perpetrator of  N/A   Witness / Exposure to Domestic Violence: No   Protective Services Involvement: No  Witness to [employer] Violence:  No   Family History:  Family History  Problem Relation Age of Onset   Heart attack Father    High blood pressure Father    Heart disease Father    Colon cancer Neg Hx    Colon polyps Neg Hx    Esophageal cancer Neg Hx    Rectal cancer Neg Hx    Stomach cancer Neg Hx     Living situation: the patient lives with their spouse  Sexual Orientation: Straight  Relationship Status: married  Name of spouse / other:[name] If a parent, number of children / ages:two adult biological daughters and one step-daughter.  Support Systems: N/A  Financial Stress:  No   Income/Employment/Disability: Architect: No   Educational History: Education: Risk manager: unknown  Any cultural differences that may affect / interfere with  treatment:  not applicable   Recreation/Hobbies: exercise, shooting, cars  Stressors: Other: wife's illness, transition to retirement.     Strengths: Self Advocate  Barriers:  social isolation   Legal History: Pending legal issue / charges: The patient has no significant history of legal issues. History of legal issue / charges:  N/A  Medical History/Surgical History: reviewed Past Medical History:  Diagnosis Date   Allergy    High cholesterol    Hypertension     Past Surgical History:  Procedure Laterality Date   CHOLECYSTECTOMY N/A 11/21/2016   Procedure: LAPAROSCOPIC CHOLECYSTECTOMY;  Surgeon: Violeta Gelinas, MD;  Location: [facility] OR;  Service: General;  Laterality: N/A;   COLONOSCOPY      Medications: Current Outpatient Medications  Medication Sig Dispense Refill    amLODipine-benazepril (LOTREL) 5-40 MG capsule TAKE 1 CAPSULE BY MOUTH IN THE EVENING 90 capsule 3   aspirin EC 81 MG tablet Take 81 mg by mouth every evening.      famotidine (PEPCID) 20 MG tablet TAKE 1 TABLET BY MOUTH TWICE A DAY 180 tablet 1   montelukast (SINGULAIR) 10 MG tablet TAKE 1 TABLET BY MOUTH EVERY DAY IN THE EVENING 90 tablet 1   rosuvastatin (CRESTOR) 40 MG tablet Take 1 tablet (40 mg total) by mouth daily. 90 tablet 3   No current facility-administered medications for this visit.    Allergies  Allergen Reactions   Bee Venom     swelling   No Known Allergies    Initial Session: Patient was last seen in 2022. He retired from [employer] in May. Wife had been retired for 4 years and he felt the timing was good to stop. Wife was having some cognitive problems and she has been diagnosed with early onset Alzheimer's. He said she is doing well and does "mind exercises" every day and is staying active. She will have more extensive testing in the coming weeks.  Also, Saabir's 29 year old mother, who lives in a retirement facility, woke up and was completely disoriented. They think she had a TIA. She is still able to be independent. He states that he is trying to adjust to retirement. He finds he has "high anxiety" and feels some depression. Tries to stay busy. His hope is to keep wife in the house as long as possible. His oldest daughter from first marriage is getting next Friday. She and boyfriend broke up but then got back together 8-9 months ago. Ramond wanted to talk with the guy, but he hasn't reached out to [place]. She has not asked Tristin to come to the destination wedding. His other daughter will not talk with him. He says he has depressive feeling with all of the negative things happening in his life. Has few hobbies and few interests. Wife has 1 daughter with 2 kids and Juston and his wife are involved. Lesean is feeling bad that he is not being respected by family. He feels bad that he lashes out  verbally at his wife. He is feeling very lonely and has not developed a social network. His days are not structured and he feels a little lost. The marital relationship is good, but is not sufficient to be fulfilling. His level of gratification is minimal. He says that he reflects on his life and questions what he has accomplished and is now looking at a future of taking care of his wife and losing his mother. He feels anger about "what the future holds" and is disappointed about what he  is facing. This contributes to feeling anxious related to all of the uncertainty. Outlets are working out, house chores, hanging with grandchildren, gun range. These do not fill his time and are mostly individual activities. He does not drink and therefore doesn't go drinking with the guys.  He is seeking counseling to best learn how to navigate these difficult times. He wants strategies to have a more fulfilling and gratifying life.        Goals/Treatment Plan: Patient is seeking couseling to facilitate adjustment to numerous life changes, including wife's illness, mother's illness, retirement, strained relationships (daughters, brother). Seeking to reduce symptoms of both anxiety and depression. Does not want to consider medication. Will utilize outpatient psychotherapy to explore FOO issues and develop appropriate behavioral strategies. Will also utilize insight oriented therapy. Goal date for resolution is 5-25. Diagnoses:  Adjustment Disorder with Anxiety and Depression.  Plan of Care: Outpatient psychotherapy  Session notes: Tevan says that his mother worsened and she now has an aid daily that checks on her. She has difficulty talking and getting thoughts together. Wife has another cognitive test this week. It is related to what medication she will be approved to take. He talked about his efforts to get their family together and have them visit her. Is doing a better job of self care and using his home gym 3 times a  week. He continues to text kids and doesn't get a response. We discussed strategy moving forward with them.                    Garrel Ridgel, PhD 8:40a-9:30p 50 minutes

## 2023-07-12 DIAGNOSIS — R931 Abnormal findings on diagnostic imaging of heart and coronary circulation: Secondary | ICD-10-CM | POA: Diagnosis not present

## 2023-07-12 DIAGNOSIS — E782 Mixed hyperlipidemia: Secondary | ICD-10-CM | POA: Diagnosis not present

## 2023-07-13 ENCOUNTER — Encounter: Payer: Self-pay | Admitting: Cardiology

## 2023-07-13 LAB — LIPID PANEL
Chol/HDL Ratio: 3 {ratio} (ref 0.0–5.0)
Cholesterol, Total: 130 mg/dL (ref 100–199)
HDL: 43 mg/dL (ref >39)
LDL Chol Calc (NIH): 68 mg/dL (ref 0–99)
Triglycerides: 103 mg/dL (ref 0–149)
VLDL Cholesterol Cal: 19 mg/dL (ref 5–40)

## 2023-07-16 ENCOUNTER — Ambulatory Visit: Payer: Self-pay | Admitting: Psychology

## 2023-07-16 DIAGNOSIS — F4323 Adjustment disorder with mixed anxiety and depressed mood: Secondary | ICD-10-CM

## 2023-07-16 NOTE — Progress Notes (Signed)
 Garrel Ridgel, PhD   [facility] Behavioral Health Counselor Initial Adult Exam  Name: Tracy Smith Date: 07/16/2023 MRN: [id number] DOB: 1955/12/28 PCP: Sharlene Dory, DO  Time Spent: 3:05  pm - 4:00 pm: 55 Minutes   Tracy Smith participated from home, via video, and consented to treatment. Therapist participated from home office. We met online due to COVID pandemic.   Guardian/Payee:  N/A    Paperwork requested: No   Reason for Visit /Presenting Problem: Anxiety and depression related to current life circumstances.  Mental Status Exam: Appearance:   Casual     Behavior:  Appropriate  Motor:  Normal  Speech/Language:   Normal Rate  Affect:  Appropriate  Mood:  depressed  Thought process:  normal  Thought content:    WNL  Sensory/Perceptual disturbances:    WNL  Orientation:  oriented to person, place, and situation  Attention:  Good  Concentration:  Good  Memory:  WNL  Fund of knowledge:   Good  Insight:    Good  Judgment:   Good  Impulse Control:  Good     Reported Symptoms:  Anxiety, sadness, frustration, agitation  Risk Assessment: Danger to Self:  No Self-injurious Behavior: No Danger to Others: No Duty to Warn:no Physical Aggression / Violence:No  Access to Firearms a concern: Yes  Gang Involvement:No  Patient / guardian was educated about steps to take if suicide or homicide risk level increases between visits: n/a While future psychiatric events cannot be accurately predicted, the patient does not currently require acute inpatient psychiatric care and does not currently meet [facility] involuntary commitment criteria.  Substance Abuse History: Current substance abuse: No     Past Psychiatric History:    No previous psychological problems have been observed Outpatient Providers:Timoty Bourke, Ph.D. History of Psych Hospitalization: No  Psychological Testing:  N/A    Abuse History:  Victim of: No.,  N/A    Report needed: No. Victim of Neglect:No. Perpetrator of  N/A   Witness / Exposure to Domestic Violence: No   Protective Services Involvement: No  Witness to [employer] Violence:  No   Family History:  Family History  Problem Relation Age of Onset   Heart attack Father    High blood pressure Father    Heart disease Father    Colon cancer Neg Hx    Colon polyps Neg Hx    Esophageal cancer Neg Hx    Rectal cancer Neg Hx    Stomach cancer Neg Hx     Living situation: the patient lives with their spouse  Sexual Orientation: Straight  Relationship Status: married  Name of spouse / other:[name] If a parent, number of children / ages:two adult biological daughters and one step-daughter.  Support Systems: N/A  Financial Stress:  No   Income/Employment/Disability: Pension  Military Service: No   Educational History: Education: Risk manager: unknown  Any cultural differences that may affect / interfere with treatment:  not applicable   Recreation/Hobbies: exercise, shooting, cars  Stressors: Other: wife's illness, transition to retirement.     Strengths: Self Advocate  Barriers:  social isolation   Legal History: Pending legal issue / charges: The patient has no significant history of legal issues. History of legal issue / charges:  N/A  Medical History/Surgical History: reviewed Past Medical History:  Diagnosis Date   Allergy    High cholesterol    Hypertension     Past Surgical History:  Procedure Laterality Date   CHOLECYSTECTOMY N/A 11/21/2016   Procedure: LAPAROSCOPIC CHOLECYSTECTOMY;  Surgeon: Violeta Gelinas, MD;  Location: [facility] OR;  Service: General;  Laterality: N/A;   COLONOSCOPY      Medications: Current  Outpatient Medications  Medication Sig Dispense Refill   amLODipine-benazepril (LOTREL) 5-40 MG capsule TAKE 1 CAPSULE BY MOUTH IN THE EVENING 90 capsule 3   aspirin EC 81 MG tablet Take 81 mg by mouth every evening.      famotidine (PEPCID) 20 MG tablet TAKE 1 TABLET BY MOUTH TWICE A DAY 180 tablet 1   montelukast (SINGULAIR) 10 MG tablet TAKE 1 TABLET BY MOUTH EVERY DAY IN THE EVENING 90 tablet 1   rosuvastatin (CRESTOR) 40 MG tablet Take 1 tablet (40 mg total) by mouth daily. 90 tablet 3   No current facility-administered medications for this visit.    Allergies  Allergen Reactions   Bee Venom     swelling   No Known Allergies    Initial Session: Patient was last seen in 2022. He retired from [employer] in May. Wife had been retired for 4 years and he felt the timing was good to stop. Wife was having some cognitive problems and she has been diagnosed with early onset Alzheimer's. He said she is doing well and does "mind exercises" every day and is staying active. She will have more extensive testing in the coming weeks.  Also, Tracy Smith's 64 year old mother, who lives in a retirement facility, woke up and was completely disoriented. They think she had a TIA. She is still able to be independent. He states that he is trying to adjust to retirement. He finds he has "high anxiety" and feels some depression. Tries to stay busy. His hope is to keep wife in the house as long as possible. His oldest daughter from first marriage is getting next Friday. She and boyfriend broke up but then got back together 8-9 months ago. Tracy Smith wanted to talk with the guy, but he hasn't reached out to Tracy Smith. She has not asked Tracy Smith to come to the destination wedding. His other daughter will not talk with him. He says he has depressive feeling with all of the negative things happening in his life. Has few hobbies and few interests. Wife has 1 daughter with 2 kids and Tracy Smith and his wife are involved. Tracy Smith is feeling bad that he is not  being respected by family. He feels bad that he lashes out verbally at his wife. He is feeling very lonely and has not developed a social network. His days are not structured and he feels a little lost. The marital relationship is good, but is not sufficient to be fulfilling. His level of gratification is minimal. He says that he reflects on his life and questions what he has accomplished and is now looking at a future  of taking care of his wife and losing his mother. He feels anger about "what the future holds" and is disappointed about what he is facing. This contributes to feeling anxious related to all of the uncertainty. Outlets are working out, house chores, hanging with grandchildren, gun range. These do not fill his time and are mostly individual activities. He does not drink and therefore doesn't go drinking with the guys.  He is seeking counseling to best learn how to navigate these difficult times. He wants strategies to have a more fulfilling and gratifying life.        Goals/Treatment Plan: Patient is seeking couseling to facilitate adjustment to numerous life changes, including wife's illness, mother's illness, retirement, strained relationships (daughters, brother). Seeking to reduce symptoms of both anxiety and depression. Does not want to consider medication. Will utilize outpatient psychotherapy to explore FOO issues and develop appropriate behavioral strategies. Will also utilize insight oriented therapy. Goal date for resolution is 5-25. Diagnoses:  Adjustment Disorder with Anxiety and Depression.  Plan of Care: Outpatient psychotherapy  Session notes: Tracy Smith says that Tracy Smith got sick this past week (in physical pain) and they had to go to the ER. They were there for 7 hours and could not find anything to diagnose. They think it was likely a bad muscle strain. Got better after a couple of days. On the cognitive tests, She "dropped a few points", but she is not eligable yet for some of the  newer medicines. Stress levels are now more manageable than before because he is working harder to "not stress her out" and be more tolerant. We discussed how he relates to his mother versus how his brother handles the situation. He says that he has not talked to daughter Tracy Smith since November and hopes that they will reconnect. He writes her and his younger daughter weekly to try to initiate communication. Will continue to stress self-care. At some point, may consider another tactic with daughters in order to facilitate a communication.                       Garrel Ridgel, PhD 8:40a-9:30p 50 minutes

## 2023-07-19 ENCOUNTER — Other Ambulatory Visit: Payer: Self-pay | Admitting: Family Medicine

## 2023-07-30 ENCOUNTER — Ambulatory Visit (INDEPENDENT_AMBULATORY_CARE_PROVIDER_SITE_OTHER): Payer: Self-pay | Admitting: Psychology

## 2023-07-30 DIAGNOSIS — F4323 Adjustment disorder with mixed anxiety and depressed mood: Secondary | ICD-10-CM | POA: Diagnosis not present

## 2023-07-30 NOTE — Progress Notes (Signed)
 Tracy Ridgel, PhD   Tracy Smith  Name: Tracy Smith Date: 07/30/2023 MRN: [id number] DOB: 10-28-1955 PCP: Tracy Dory, DO  Time Spent: 3:05  pm - 4:00 pm: 55 Minutes   Tracy Smith participated from home, via video, and consented to treatment. Therapist participated from home office. We met online due to COVID pandemic.   Guardian/Payee:  N/A    Paperwork requested: No   Reason for Visit /Presenting Problem: Anxiety and depression related to current life circumstances.  Mental Status Smith: Appearance:   Casual     Behavior:  Appropriate  Motor:  Normal  Speech/Language:   Normal Rate  Affect:  Appropriate  Mood:  depressed  Thought process:  normal  Thought content:    WNL  Sensory/Perceptual disturbances:    WNL  Orientation:  oriented to person, place, and situation  Attention:  Good  Concentration:  Good  Memory:  WNL  Fund of knowledge:   Good  Insight:    Good  Judgment:   Good  Impulse Control:  Good     Reported Symptoms:  Anxiety, sadness, frustration, agitation  Risk Assessment: Danger to Self:  No Self-injurious Behavior: No Danger to Others: No Duty to Warn:no Physical Aggression / Smith:No  Access to Firearms a concern: Yes  Gang Involvement:No  Patient / guardian was educated about steps to take if suicide or homicide risk level increases between visits: n/a While future psychiatric events cannot be accurately predicted, the patient does not currently require acute inpatient psychiatric care and does not currently meet Tracy Smith criteria.  Substance Abuse History: Current substance abuse: No      Past Psychiatric History:   No previous psychological problems have been observed Outpatient Providers:Tracy Smith, Ph.D. History of Psych Hospitalization: No  Psychological Testing:  N/A    Abuse History:  Victim of: No.,  N/A    Report needed: No. Victim of Neglect:No. Perpetrator of  N/A   Witness / Exposure to Domestic Smith: No   Protective Services Involvement: No  Witness to Tracy Smith:  No   Family History:  Family History  Problem Relation Age of Onset   Heart attack Father    High blood pressure Father    Heart disease Father    Colon cancer Neg Hx    Colon polyps Neg Hx    Esophageal cancer Neg Hx    Rectal cancer Neg Hx    Stomach cancer Neg Hx     Living situation: the patient lives with their spouse  Sexual Orientation: Straight  Relationship Status: married  Name of spouse / other:Tracy Smith If a parent, number of children / ages:two adult biological daughters and one  step-daughter.  Support Systems: N/A  Financial Stress:  No   Income/Employment/Disability: Architect: No   Educational History: Education: Risk manager: unknown  Any cultural differences that may affect / interfere with treatment:  not applicable   Recreation/Hobbies: exercise, shooting, cars  Stressors: Other: wife's illness, transition to retirement.     Strengths: Self Advocate  Barriers:  social isolation   Legal History: Pending legal issue / charges: The patient has no significant history of legal issues. History of legal issue / charges:  N/A  Medical History/Surgical History: reviewed Past Medical History:  Diagnosis Date   Allergy    High cholesterol    Hypertension     Past Surgical History:  Procedure Laterality Date   CHOLECYSTECTOMY N/A 11/21/2016   Procedure: LAPAROSCOPIC CHOLECYSTECTOMY;  Surgeon: Tracy Gelinas, MD;  Location: [facility];  Service: General;  Laterality: N/A;    COLONOSCOPY      Medications: Current Outpatient Medications  Medication Sig Dispense Refill   amLODipine-benazepril (LOTREL) 5-40 MG capsule Take 1 capsule by mouth daily. 90 capsule 0   aspirin EC 81 MG tablet Take 81 mg by mouth every evening.      famotidine (PEPCID) 20 MG tablet TAKE 1 TABLET BY MOUTH TWICE A DAY 180 tablet 1   montelukast (SINGULAIR) 10 MG tablet TAKE 1 TABLET BY MOUTH EVERY DAY IN THE EVENING 90 tablet 1   rosuvastatin (CRESTOR) 40 MG tablet Take 1 tablet (40 mg total) by mouth daily. 90 tablet 3   No current facility-administered medications for this visit.    Allergies  Allergen Reactions   Bee Venom     swelling   No Known Allergies    Initial Session: Patient was last seen in 2022. He retired from [employer] in May. Wife had been retired for 4 years and he felt the timing was good to stop. Wife was having some cognitive problems and she has been diagnosed with early onset Alzheimer's. He said she is doing well and does "mind exercises" every day and is staying active. She will have more extensive testing in the coming weeks.  Also, [name] 106 year old mother, who lives in a retirement facility, woke up and was completely disoriented. They think she had a TIA. She is still able to be independent. He states that he is trying to adjust to retirement. He finds he has "high anxiety" and feels some depression. Tries to stay busy. His hope is to keep wife in the house as long as possible. His oldest daughter from first marriage is getting next Friday. She and boyfriend broke up but then got back together 8-9 months ago. [name] wanted to talk with the guy, but he hasn't reached out to [name] She has not asked [name] to come to the destination wedding. His other daughter will not talk with him. He says he has depressive feeling with all of the negative things happening in his life. Has few hobbies and few interests. Wife has 1 daughter with 2 kids and [name] and his wife are involved.  [name] is feeling bad that he is not being respected by family. He feels bad that he lashes out verbally at his wife. He is feeling very lonely and has not developed a social network. His days are not structured and he feels a little lost. The marital relationship is good, but is not sufficient to be fulfilling. His level of gratification is minimal. He says that he reflects on his life  and questions what he has accomplished and is now looking at a future of taking care of his wife and losing his mother. He feels anger about "what the future holds" and is disappointed about what he is facing. This contributes to feeling anxious related to all of the uncertainty. Outlets are working out, house chores, hanging with grandchildren, gun range. These do not fill his time and are mostly individual activities. He does not drink and therefore doesn't go drinking with the guys.  He is seeking counseling to best learn how to navigate these difficult times. He wants strategies to have a more fulfilling and gratifying life.        Goals/Treatment Plan: Patient is seeking couseling to facilitate adjustment to numerous life changes, including wife's illness, mother's illness, retirement, strained relationships (daughters, brother). Seeking to reduce symptoms of both anxiety and depression. Does not want to consider medication. Will utilize outpatient psychotherapy to explore FOO issues and develop appropriate behavioral strategies. Will also utilize insight oriented therapy. Goal date for resolution is 5-25. Diagnoses:  Adjustment Disorder with Anxiety and Depression.  Plan of Care: Outpatient psychotherapy  Session notes: Elizjah says he went to see grandson play baseball over the weekend. He says he moods are better and his stress is improving. He feels he is more tolerant of his wife's condition. He isn't being overly reactive to her. He is almost a year in to retirement and is getting used to the routine. He stays up later  than wife but is working on getting to bed earlier. Talked about how he wants his retirement to look. He would like to find something to do, perhaps a bit more structured. He says that his mother is not doing well. Her memory is deteriorating. He calls her 2-3 times/day. Moods mostly stable.                            Tracy Ridgel, PhD 8:40a-9:30p 50 minutes

## 2023-08-11 ENCOUNTER — Other Ambulatory Visit: Payer: Self-pay | Admitting: Family Medicine

## 2023-08-13 ENCOUNTER — Ambulatory Visit: Payer: Self-pay | Admitting: Psychology

## 2023-08-13 MED ORDER — AMLODIPINE BESY-BENAZEPRIL HCL 5-40 MG PO CAPS: 1.0000 | ORAL_CAPSULE | Freq: Every day | ORAL | 0 refills | Status: DC

## 2023-08-23 ENCOUNTER — Telehealth: Payer: Self-pay | Admitting: Cardiology

## 2023-08-23 NOTE — Telephone Encounter (Signed)
  Per MyChart scheduling message:  Patient is asking if an order could be placed for him to have a CT Cardiac Score test done. Please advise.

## 2023-08-23 NOTE — Telephone Encounter (Signed)
 Generally, we do not recommend repeat calcium score scan once it is diagnosed and you have started on the treatment, which is statin.  I would explain the rationale more in detail when I see him in May for follow-up visit.  Thanks MJP

## 2023-08-23 NOTE — Telephone Encounter (Signed)
 Left message to return call

## 2023-08-24 NOTE — Telephone Encounter (Signed)
 Pt made aware of recommendation and agreeable to plan.

## 2023-08-24 NOTE — Telephone Encounter (Signed)
 Patient was returning call. Please advise ?

## 2023-08-27 ENCOUNTER — Ambulatory Visit: Payer: Self-pay | Admitting: Psychology

## 2023-08-27 DIAGNOSIS — F4323 Adjustment disorder with mixed anxiety and depressed mood: Secondary | ICD-10-CM | POA: Diagnosis not present

## 2023-08-27 NOTE — Progress Notes (Addendum)
 Tracy Nash, PhD   [facility] Counselor Initial Adult Exam  Name: Tracy Smith Date: 08/27/2023 MRN: [id number] DOB: [birth year] PCP: Tracy Mulder, DO     Tracy Smith participated from home, via video, and consented to treatment. Therapist participated from home office. We met online due to COVID pandemic.   Guardian/Payee:  N/A    Paperwork requested: No   Reason for Visit /Presenting Problem: Anxiety and depression related to current life circumstances.  Mental Status Exam: Appearance:   Casual     Behavior:  Appropriate  Motor:  Normal  Speech/Language:   Normal Rate  Affect:  Appropriate  Mood:  depressed  Thought process:  normal  Thought content:    WNL  Sensory/Perceptual disturbances:    WNL  Orientation:  oriented to person, place, and situation  Attention:  Good  Concentration:  Good  Memory:  WNL  Fund of knowledge:   Good  Insight:    Good  Judgment:   Good  Impulse Control:  Good     Reported Symptoms:  Anxiety, sadness, frustration, agitation  Risk Assessment: Danger to Self:  No Self-injurious Behavior: No Danger to Others: No Duty to Warn:no Physical Aggression / Violence:No  Access to Firearms a concern: Yes  Gang Involvement:No  Patient / guardian was educated about steps to take if suicide or homicide risk level increases between visits: n/a While future psychiatric events cannot be accurately predicted, the patient does not currently require acute inpatient psychiatric care and does not currently meet [place]  involuntary commitment criteria.  Substance Abuse History: Current substance abuse: No     Past  Psychiatric History:   No previous psychological problems have been observed Outpatient Providers:Tracy Smith, Ph.D. History of Psych Hospitalization: No  Psychological Testing:  N/A    Abuse History:  Victim of: No.,  N/A    Report needed: No. Victim of Neglect:No. Perpetrator of  N/A   Witness / Exposure to Domestic Violence: No   Protective Services Involvement: No  Witness to [employer] Violence:  No   Family History:  Family History  Problem Relation Age of Onset   Heart attack Father    High blood pressure Father    Heart disease Father    Colon cancer Neg Hx    Colon polyps Neg Hx    Esophageal cancer Neg Hx    Rectal cancer Neg Hx    Stomach cancer Neg Hx     Living situation: the patient lives with their spouse  Sexual Orientation: Straight  Relationship Status: married  Name of spouse / other:Tracy Smith If a parent, number of children /  ages:two adult biological daughters and one step-daughter.  Support Systems: N/A  Financial Stress:  No   Income/Employment/Disability: Architect: No   Educational History: Education: Risk manager: unknown  Any cultural differences that may affect / interfere with treatment:  not applicable   Recreation/Hobbies: exercise, shooting, cars  Stressors: Other: wife's illness, transition to retirement.     Strengths: Self Advocate  Barriers:  social isolation   Legal History: Pending legal issue / charges: The patient has no significant history of legal issues. History of legal issue / charges:  N/A  Medical History/Surgical History: reviewed Past Medical History:  Diagnosis Date   Allergy    High cholesterol    Hypertension     Past Surgical History:  Procedure Laterality Date   CHOLECYSTECTOMY N/A 11/21/2016   Procedure: LAPAROSCOPIC CHOLECYSTECTOMY;  Surgeon: Tracy Gander, MD;  Location: [facility] OR;  Service: General;  Laterality: N/A;   COLONOSCOPY       Medications: Current Outpatient Medications  Medication Sig Dispense Refill   amLODipine -benazepril  (LOTREL) 5-40 MG capsule Take 1 capsule by mouth daily. 90 capsule 0   aspirin EC 81 MG tablet Take 81 mg by mouth every evening.      famotidine  (PEPCID ) 20 MG tablet TAKE 1 TABLET BY MOUTH TWICE A DAY 180 tablet 1   montelukast  (SINGULAIR ) 10 MG tablet TAKE 1 TABLET BY MOUTH EVERY DAY IN THE EVENING 90 tablet 1   rosuvastatin  (CRESTOR ) 40 MG tablet Take 1 tablet (40 mg total) by mouth daily. 90 tablet 3   No current facility-administered medications for this visit.    Allergies  Allergen Reactions   Bee Venom     swelling   No Known Allergies    Initial Session: Patient was last seen in 2022. He retired from [employer] in May. Wife had been retired for 4 years and he felt the timing was good to stop. Wife was having some cognitive problems and she has been diagnosed with early onset Alzheimer's. He said she is doing well and does "mind exercises" every day and is staying active. She will have more extensive testing in the coming weeks.  Also, [name]'s 29 year old mother, who lives in a retirement facility, woke up and was completely disoriented. They think she had a TIA. She is still able to be independent. He states that he is trying to adjust to retirement. He finds he has "high anxiety" and feels some depression. Tries to stay busy. His hope is to keep wife in the house as long as possible. His oldest daughter from first marriage is getting next Friday. She and boyfriend broke up but then got back together 8-9 months ago. [name] wanted to talk with the guy, but he hasn't reached out to [name]. She has not asked [name] to come to the destination wedding. His other daughter will not talk with him. He says he has depressive feeling with all of the negative things happening in his life. Has few hobbies and few interests. Wife has 1 daughter with 2 kids and [name] and his wife are involved. [name] is feeling  bad that he is not being respected by family. He feels bad that he lashes out verbally at his wife. He is feeling very lonely and has not developed a social network. His days are not structured and he feels a little lost. The marital relationship is good, but is not sufficient to be fulfilling. His level of gratification is minimal. He says  that he reflects on his life and questions what he has accomplished and is now looking at a future of taking care of his wife and losing his mother. He feels anger about "what the future holds" and is disappointed about what he is facing. This contributes to feeling anxious related to all of the uncertainty. Outlets are working out, house chores, hanging with grandchildren, gun range. These do not fill his time and are mostly individual activities. He does not drink and therefore doesn't go drinking with the guys.  He is seeking counseling to best learn how to navigate these difficult times. He wants strategies to have a more fulfilling and gratifying life.        Goals/Treatment Plan: Patient is seeking couseling to facilitate adjustment to numerous life changes, including wife's illness, mother's illness, retirement, strained relationships (daughters, brother). Seeking to reduce symptoms of both anxiety and depression. Does not want to consider medication. Will utilize outpatient psychotherapy to explore FOO issues and develop appropriate behavioral strategies. Will also utilize insight oriented therapy. Goal date for resolution is 5-25. Diagnoses:  Adjustment Disorder with Anxiety and Depression.  Plan of Care: Outpatient psychotherapy  Session notes: Leiby says he took grandson to [employer]. He talked about the frustrations with his brother, especially about the situation with his mother. He wants the negativity out of his life. Feels he his out of sync with having a routine. He tends to be "all in or all out" and wants to get back to moderation and  routine. Also the lack of a good relationship with his daughter is bothersome. He feels that he expects more from his kids and grand kids. We talked about reducing stress at the same time of caring for wife and rebuilding family relationships.                                 Tracy Nash, PhD 12:10p-1:00p 50 minutes

## 2023-09-10 ENCOUNTER — Ambulatory Visit: Payer: Self-pay | Admitting: Psychology

## 2023-09-10 DIAGNOSIS — F4323 Adjustment disorder with mixed anxiety and depressed mood: Secondary | ICD-10-CM | POA: Diagnosis not present

## 2023-09-10 NOTE — Progress Notes (Addendum)
 [name], PhD   [facility] Counselor Initial Adult Exam  Name: Bryler Peskin Date: 09/10/2023 MRN: [id number] DOB: 1956/04/12 PCP: Jobe Mulder, DO     [name] participated from home, via video, and consented to treatment. Therapist participated from home office. We met online due to COVID pandemic.   Guardian/Payee:  N/A    Paperwork requested: No   Reason for Visit /Presenting Problem: Anxiety and depression related to current life circumstances.  Mental Status Exam: Appearance:   Casual     Behavior:  Appropriate  Motor:  Normal  Speech/Language:   Normal Rate  Affect:  Appropriate  Mood:  depressed  Thought process:  normal  Thought content:    WNL  Sensory/Perceptual disturbances:    WNL  Orientation:  oriented to person, place, and situation  Attention:  Good  Concentration:  Good  Memory:  WNL  Fund of knowledge:   Good  Insight:    Good  Judgment:   Good  Impulse Control:  Good     Reported Symptoms:  Anxiety, sadness, frustration, agitation  Risk Assessment: Danger to Self:  No Self-injurious Behavior: No Danger to Others: No Duty to Warn:no Physical Aggression / Violence:No  Access to Firearms a concern: Yes  Gang Involvement:No  Patient / guardian was educated about steps to take if suicide or homicide risk level increases between visits: n/a While future psychiatric events cannot be accurately predicted, the patient does not currently require acute inpatient psychiatric care and does not currently meet [place]  involuntary commitment criteria.  Substance Abuse History: Current  substance abuse: No     Past Psychiatric History:   No previous psychological problems have been observed Outpatient Providers:Missy Baksh, Ph.D. History of Psych Hospitalization: No  Psychological Testing: N/A   Abuse History:  Victim of: No., N/A   Report needed: No. Victim of Neglect:No. Perpetrator of N/A  Witness / Exposure to Domestic Violence: No   Protective Services Involvement: No  Witness to [employer] Violence:  No   Family History:  Family History  Problem Relation Age of Onset   Heart attack Father    High blood pressure Father    Heart disease Father    Colon cancer Neg Hx    Colon polyps Neg Hx    Esophageal cancer Neg Hx    Rectal cancer Neg Hx    Stomach cancer Neg Hx     Living situation: the patient lives with their spouse  Sexual Orientation: Straight  Relationship Status: married  Name of spouse /  other:[name] If a parent, number of children / ages:two adult biological daughters and one step-daughter.  Support Systems: N/A  Financial Stress:  No   Income/Employment/Disability: Architect: No   Educational History: Education: Risk manager: unknown  Any cultural differences that may affect / interfere with treatment:  not applicable   Recreation/Hobbies: exercise, shooting, cars  Stressors: Other: wife's illness, transition to retirement.     Strengths: Self Advocate  Barriers:  social isolation   Legal History: Pending legal issue / charges: The patient has no significant history of legal issues. History of legal issue / charges: N/A  Medical History/Surgical History: reviewed Past Medical History:  Diagnosis Date   Allergy    High cholesterol    Hypertension     Past Surgical History:  Procedure Laterality Date   CHOLECYSTECTOMY N/A 11/21/2016   Procedure: LAPAROSCOPIC CHOLECYSTECTOMY;  Surgeon: Dorena Gander, MD;  Location: [facility] OR;  Service: General;  Laterality:  N/A;   COLONOSCOPY      Medications: Current Outpatient Medications  Medication Sig Dispense Refill   amLODipine -benazepril  (LOTREL) 5-40 MG capsule Take 1 capsule by mouth daily. 90 capsule 0   aspirin EC 81 MG tablet Take 81 mg by mouth every evening.      famotidine  (PEPCID ) 20 MG tablet TAKE 1 TABLET BY MOUTH TWICE A DAY 180 tablet 1   montelukast  (SINGULAIR ) 10 MG tablet TAKE 1 TABLET BY MOUTH EVERY DAY IN THE EVENING 90 tablet 1   rosuvastatin  (CRESTOR ) 40 MG tablet Take 1 tablet (40 mg total) by mouth daily. 90 tablet 3   No current facility-administered medications for this visit.    Allergies  Allergen Reactions   Bee Venom     swelling   No Known Allergies    Initial Session: Patient was last seen in 2022. He retired from [employer] in May. Wife had been retired for 4 years and he felt the timing was good to stop. Wife was having some cognitive problems and she has been diagnosed with early onset Alzheimer's. He said she is doing well and does "mind exercises" every day and is staying active. She will have more extensive testing in the coming weeks.  Also, Tel's 9 year old mother, who lives in a retirement facility, woke up and was completely disoriented. They think she had a TIA. She is still able to be independent. He states that he is trying to adjust to retirement. He finds he has "high anxiety" and feels some depression. Tries to stay busy. His hope is to keep wife in the house as long as possible. His oldest daughter from first marriage is getting next Friday. She and boyfriend broke up but then got back together 8-9 months ago. Zelmer wanted to talk with the guy, but he hasn't reached out to [place]. She has not asked Jamesanthony to come to the destination wedding. His other daughter will not talk with him. He says he has depressive feeling with all of the negative things happening in his life. Has few hobbies and few interests. Wife has 1 daughter with 2 kids and Mattan and his wife are  involved. Nereo is feeling bad that he is not being respected by family. He feels bad that he lashes out verbally at his wife. He is feeling very lonely and has not developed a social network. His days are not structured and he feels a little lost. The marital relationship is good, but is not sufficient to be fulfilling. His  level of gratification is minimal. He says that he reflects on his life and questions what he has accomplished and is now looking at a future of taking care of his wife and losing his mother. He feels anger about "what the future holds" and is disappointed about what he is facing. This contributes to feeling anxious related to all of the uncertainty. Outlets are working out, house chores, hanging with grandchildren, gun range. These do not fill his time and are mostly individual activities. He does not drink and therefore doesn't go drinking with the guys.  He is seeking counseling to best learn how to navigate these difficult times. He wants strategies to have a more fulfilling and gratifying life.        Goals/Treatment Plan: Patient is seeking couseling to facilitate adjustment to numerous life changes, including wife's illness, mother's illness, retirement, strained relationships (daughters, brother). Seeking to reduce symptoms of both anxiety and depression. Does not want to consider medication. Will utilize outpatient psychotherapy to explore FOO issues and develop appropriate behavioral strategies. Will also utilize insight oriented therapy. Goal date for resolution is 5-25. Diagnoses:  Adjustment Disorder with Anxiety and Depression.  Plan of Care: Outpatient psychotherapy  Session notes: Jamine says he took grandson to another baseball game. He finds himself getting easily frustrated with Nira Basset and also when he takes care of grandchildren. Mylinda Asa is step-daughter, but he treats her as his daughter. His own daughter has not talked to him in six months even though he sends her a  weekly text to "check in". His "agitation" has slightly improved, but still exists. His attempt to give his life more structure has been minimal. He resists having to be "obligated" anywhere. Days are unstructured and he distracts with books/TV. Discussed finding some additional activities that are social and separate from wife. Very limited (non-existent?) social network.                                    [name], PhD 8:40a-9:30a 50 minutes

## 2023-09-16 ENCOUNTER — Other Ambulatory Visit: Payer: Self-pay | Admitting: Family Medicine

## 2023-09-19 ENCOUNTER — Ambulatory Visit (INDEPENDENT_AMBULATORY_CARE_PROVIDER_SITE_OTHER): Payer: Self-pay | Admitting: Psychology

## 2023-09-19 DIAGNOSIS — F4323 Adjustment disorder with mixed anxiety and depressed mood: Secondary | ICD-10-CM
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
## 2023-09-24 ENCOUNTER — Ambulatory Visit: Payer: Self-pay | Admitting: Psychology

## 2023-09-25 ENCOUNTER — Encounter: Payer: Self-pay | Admitting: Cardiology

## 2023-09-25 ENCOUNTER — Ambulatory Visit: Attending: Cardiology | Admitting: Cardiology

## 2023-09-25 VITALS — BP 124/78 | HR 89 | Resp 16 | Ht 68.0 in | Wt 223.6 lb

## 2023-09-25 DIAGNOSIS — I25118 Atherosclerotic heart disease of native coronary artery with other forms of angina pectoris: Secondary | ICD-10-CM | POA: Insufficient documentation

## 2023-09-25 DIAGNOSIS — R931 Abnormal findings on diagnostic imaging of heart and coronary circulation: Secondary | ICD-10-CM

## 2023-09-25 DIAGNOSIS — E782 Mixed hyperlipidemia: Secondary | ICD-10-CM | POA: Diagnosis not present

## 2023-09-25 MED ORDER — ROSUVASTATIN CALCIUM 40 MG PO TABS: 40.0000 mg | ORAL_TABLET | Freq: Every day | ORAL | 3 refills | Status: AC

## 2023-09-25 MED ORDER — ASPIRIN EC 81 MG PO TBEC: 81.0000 mg | DELAYED_RELEASE_TABLET | Freq: Every evening | ORAL | 3 refills | Status: AC

## 2023-09-25 MED ORDER — AMLODIPINE BESY-BENAZEPRIL HCL 5-40 MG PO CAPS: 1.0000 | ORAL_CAPSULE | Freq: Every day | ORAL | 3 refills | Status: DC

## 2023-09-25 NOTE — Patient Instructions (Signed)
 Medication Instructions:  Refills sent in   *If you need a refill on your cardiac medications before your next appointment, please call your pharmacy*  Follow-Up: At [facility], you and your health needs are our priority.  As part of our continuing mission to provide you with exceptional heart care, our providers are all part of one team.  This team includes your primary Cardiologist (physician) and Advanced Practice Providers or APPs (Physician Assistants and Nurse Practitioners) who all work together to provide you with the care you need, when you need it.  Your next appointment:   1 year(s)  Provider:   Cody Das, MD    We recommend signing up for the patient portal called "MyChart".  Sign up information is provided on this After Visit Summary.  MyChart is used to connect with patients for Virtual Visits (Telemedicine).  Patients are able to view lab/test results, encounter notes, upcoming appointments, etc.  Non-urgent messages can be sent to your provider as well.   To learn more about what you can do with MyChart, go to [email or web address].

## 2023-09-25 NOTE — Progress Notes (Signed)
 Cardiology Office Note:  .   Date:  09/25/2023  ID:  Tracy Smith, DOB 1955-10-05, MRN [id number] PCP: Jobe Mulder, DO  [facility] Providers Cardiologist:  Fransico Ivy, MD PCP: Jobe Mulder, DO  Chief Complaint  Patient presents with   Elevated coronary artery calcium  score   Follow-up      History of Present Illness: .    Tracy Smith is a 68 y.o. male with hypertension, hyperlipidemia, elevated coronary calcium     Patient is doing well.  He retired a year ago, is now staying active with regular exercise and stretching bicycle without any complains of chest pain shortness of breath.  He is compliant with his medical therapy and tries to eat heart healthy diet.  He eats steak like fairly many on only occasionally.  Reviewed recent lab results with the patient, details below.  Vitals:   09/25/23 1022  BP: 124/78  Pulse: 89  Resp: 16  SpO2: 97%      ROS:  Review of Systems  Cardiovascular:  Negative for chest pain, dyspnea on exertion, leg swelling, palpitations and syncope.     Studies Reviewed: Aaron Aas        EKG 04/09/2023: Sinus tachycardia Left axis deviation When compared with ECG of 17-Nov-2016 08:19, QRS axis Shifted left Questionable change in initial forces of Inferior leads  Labs 06/2023: Chol 130, TG 103, HDL 43, LDL 68  Labs 01/16/2023: Chol 148, TG 115, HDL 40, LDL 85 Lipoprotein (a) 38 normal  CT cardiac scoring 09/02/2021: Coronary Calcium  Score: Left main: 0 Left anterior descending artery: 472 Left circumflex artery: 72 Right coronary artery: 151   Total: 695 Percentile: 91   Pericardium: Normal. Ascending Aorta: Normal caliber.   Physical Exam:   Physical Exam Vitals and nursing note reviewed.  Constitutional:      General: He is not in acute distress. Neck:     Vascular: No JVD.  Cardiovascular:     Rate and Rhythm: Normal rate and regular rhythm.     Heart sounds: Normal heart sounds. No  murmur heard. Pulmonary:     Effort: Pulmonary effort is normal.     Breath sounds: Normal breath sounds. No wheezing or rales.  Musculoskeletal:     Right lower leg: No edema.     Left lower leg: No edema.     VISIT DIAGNOSES:   ICD-10-CM   1. Coronary artery disease of native artery of native heart with stable angina pectoris (HCC)  I25.118     2. Elevated coronary artery calcium  score  R93.1 rosuvastatin  (CRESTOR ) 40 MG tablet    3. Mixed hyperlipidemia  E78.2 rosuvastatin  (CRESTOR ) 40 MG tablet        ASSESSMENT AND PLAN: .    Tracy Smith is a 68 y.o. male with hypertension, hyperlipidemia, elevated coronary calcium    Elevated coronary calcium : Multivessel calcium  91st percentile. None in LM. Structurally normal heart, no ischemia (03/2021). Continue Aspirin in absence of bleeding issues. Tolerating Crestor  40 mg daily, LDL down to 68.     Hypertension: Controlled   Mixed hyperlipidemia: As above.      Meds ordered this encounter  Medications   amLODipine -benazepril  (LOTREL) 5-40 MG capsule    Sig: Take 1 capsule by mouth daily.    Dispense:  90 capsule    Refill:  3   aspirin EC 81 MG tablet    Sig: Take 1 tablet (81 mg total) by mouth every evening.    Dispense:  90  tablet    Refill:  3   rosuvastatin  (CRESTOR ) 40 MG tablet    Sig: Take 1 tablet (40 mg total) by mouth daily.    Dispense:  90 tablet    Refill:  3     F/u in 6 months  Signed, Cody Das, MD

## 2023-10-02 ENCOUNTER — Ambulatory Visit (INDEPENDENT_AMBULATORY_CARE_PROVIDER_SITE_OTHER)

## 2023-10-02 VITALS — Ht 68.0 in | Wt 223.0 lb

## 2023-10-02 DIAGNOSIS — Z0000 Encounter for general adult medical examination without abnormal findings: Secondary | ICD-10-CM | POA: Diagnosis not present

## 2023-10-02 NOTE — Patient Instructions (Signed)
 Mr. Tracy Smith , Thank you for taking time out of your busy schedule to complete your Annual Wellness Visit with me. I enjoyed our conversation and look forward to speaking with you again next year. I, as well as your care team,  appreciate your ongoing commitment to your health goals. Please review the following plan we discussed and let me know if I can assist you in the future. Your Game plan/ To Do List    Follow up Visits: Next Medicare AWV with our clinical staff: In 1 year    Have you seen your provider in the last 6 months (3 months if uncontrolled diabetes)? No Next Office Visit with your provider: Yearly due in September  Clinician Recommendations:  Aim for 30 minutes of exercise or brisk walking, 6-8 glasses of water, and 5 servings of fruits and vegetables each day.       This is a list of the screening recommended for you and due dates:  Health Maintenance  Topic Date Due   Zoster (Shingles) Vaccine (1 of 2) Never done   Flu Shot  12/14/2023   DTaP/Tdap/Td vaccine (2 - Td or Tdap) 04/27/2024   Medicare Annual Wellness Visit  10/01/2024   Colon Cancer Screening  12/14/2027   Pneumonia Vaccine  Completed   Hepatitis C Screening  Completed   HIV Screening  Completed   HPV Vaccine  Aged Out   Meningitis B Vaccine  Aged Out   COVID-19 Vaccine  Discontinued    Advanced directives: (In Chart) A copy of your advanced directives are scanned into your chart should your provider ever need it.  Advance Care Planning is important because it:  [x]  Makes sure you receive the medical care that is consistent with your values, goals, and preferences  [x]  It provides guidance to your family and loved ones and reduces their decisional burden about whether or not they are making the right decisions based on your wishes.  Follow the link provided in your after visit summary or read over the paperwork we have mailed to you to help you started getting your Advance Directives in place. If you  need assistance in completing these, please reach out to us  so that we can help you!  See attachments for Preventive Care and Fall Prevention Tips.

## 2023-10-02 NOTE — Progress Notes (Signed)
 Subjective:   Tracy Smith is a 68 y.o. who presents for a Medicare Wellness preventive visit.  As a reminder, Annual Wellness Visits don't include a physical exam, and some assessments may be limited, especially if this visit is performed virtually. We may recommend an in-person follow-up visit with your provider if needed.  Visit Complete: Virtual I connected with  Tracy Smith on 10/02/23 by a audio enabled telemedicine application and verified that I am speaking with the correct person using two identifiers.  Patient Location: Home  Provider Location: Home Office  I discussed the limitations of evaluation and management by telemedicine. The patient expressed understanding and agreed to proceed.  Vital Signs: Because this visit was a virtual/telehealth visit, some criteria may be missing or patient reported. Any vitals not documented were not able to be obtained and vitals that have been documented are patient reported.  VideoDeclined- This patient declined Librarian, academic. Therefore the visit was completed with audio only.  Persons Participating in Visit: Patient.  AWV Questionnaire: No: Patient Medicare AWV questionnaire was not completed prior to this visit.  Cardiac Risk Factors include: advanced age (>75men, >17 women);male gender;hypertension;dyslipidemia     Objective:     Today's Vitals   10/02/23 1128  Weight: 223 lb (101.2 kg)  Height: 5\' 8"  (1.727 m)   Body mass index is 33.91 kg/m.     10/02/2023   11:32 AM 11/17/2016    8:26 AM  Advanced Directives  Does Patient Have a Medical Advance Directive? Yes No  Type of Estate agent of [place];Living will   Does patient want to make changes to medical advance directive? No - Patient declined   Copy of Healthcare Power of Attorney in Chart? Yes - validated most recent copy scanned in chart (See row information)   Would patient like information on creating a  medical advance directive?  Yes (ED - Information included in AVS)    Current Medications (verified) Outpatient Encounter Medications as of 10/02/2023  Medication Sig   amLODipine -benazepril  (LOTREL) 5-40 MG capsule Take 1 capsule by mouth daily.   aspirin  EC 81 MG tablet Take 1 tablet (81 mg total) by mouth every evening.   famotidine  (PEPCID ) 20 MG tablet TAKE 1 TABLET BY MOUTH TWICE A DAY   montelukast  (SINGULAIR ) 10 MG tablet TAKE 1 TABLET BY MOUTH EVERY DAY IN THE EVENING   rosuvastatin  (CRESTOR ) 40 MG tablet Take 1 tablet (40 mg total) by mouth daily.   No facility-administered encounter medications on file as of 10/02/2023.    Allergies (verified) Bee venom and No known allergies   History: Past Medical History:  Diagnosis Date   Allergy    High cholesterol    Hypertension    Past Surgical History:  Procedure Laterality Date   CHOLECYSTECTOMY N/A 11/21/2016   Procedure: LAPAROSCOPIC CHOLECYSTECTOMY;  Surgeon: Dorena Gander, MD;  Location: [facility] OR;  Service: General;  Laterality: N/A;   COLONOSCOPY     Family History  Problem Relation Age of Onset   Heart attack Father    High blood pressure Father    Heart disease Father    Colon cancer Neg Hx    Colon polyps Neg Hx    Esophageal cancer Neg Hx    Rectal cancer Neg Hx    Stomach cancer Neg Hx    Social History   Socioeconomic History   Marital status: Married    Spouse name: Not on file   Number of children: 3  Years of education: Not on file   Highest education level: Not on file  Occupational History   Not on file  Tobacco Use   Smoking status: Some Days    Types: Cigars   Smokeless tobacco: Never  Vaping Use   Vaping status: Never Used  Substance and Sexual Activity   Alcohol use: Yes    Comment: occ   Drug use: No   Sexual activity: Not on file  Other Topics Concern   Not on file  Social History Narrative   Not on file   Social Drivers of Health   Financial Resource Strain: Low Risk   (10/02/2023)   Overall Financial Resource Strain (CARDIA)    Difficulty of Paying Living Expenses: Not hard at all  Food Insecurity: No Food Insecurity (10/02/2023)   Hunger Vital Sign    Worried About Running Out of Food in the Last Year: Never true    Ran Out of Food in the Last Year: Never true  Transportation Needs: No Transportation Needs (10/02/2023)   PRAPARE - Administrator, Civil Service (Medical): No    Lack of Transportation (Non-Medical): No  Physical Activity: Sufficiently Active (10/02/2023)   Exercise Vital Sign    Days of Exercise per Week: 5 days    Minutes of Exercise per Session: 30 min  Stress: No Stress Concern Present (10/02/2023)   [employer] of Occupational Health - Occupational Stress Questionnaire    Feeling of Stress : Not at all  Social Connections: Socially Integrated (10/02/2023)   Social Connection and Isolation Panel [NHANES]    Frequency of Communication with Friends and Family: More than three times a week    Frequency of Social Gatherings with Friends and Family: Three times a week    Attends Religious Services: More than 4 times per year    Active Member of Clubs or Organizations: Yes    Attends Banker Meetings: 1 to 4 times per year    Marital Status: Married    Tobacco Counseling Ready to quit: Not Answered Counseling given: Not Answered    Clinical Intake:  Pre-visit preparation completed: Yes  Pain : No/denies pain     Diabetes: No  Lab Results  Component Value Date   HGBA1C 6.0 01/16/2023   HGBA1C 6.0 09/01/2020   HGBA1C 5.7 08/26/2019     How often do you need to have someone help you when you read instructions, pamphlets, or other written materials from your doctor or pharmacy?: 1 - Never  Interpreter Needed?: No  Information entered by :: Tracy Cypress LPN   Activities of Daily Living     10/02/2023   11:29 AM  In your present state of health, do you have any difficulty performing the  following activities:  Hearing? 0  Vision? 0  Difficulty concentrating or making decisions? 0  Walking or climbing stairs? 0  Dressing or bathing? 0  Doing errands, shopping? 0  Preparing Food and eating ? N  Using the Toilet? N  In the past six months, have you accidently leaked urine? N  Do you have problems with loss of bowel control? N  Managing your Medications? N  Managing your Finances? N  Housekeeping or managing your Housekeeping? N    Patient Care Team: Jobe Mulder, DO as PCP - General (Family Medicine) Cody Das, MD as PCP - Cardiology (Cardiology)  Indicate any recent Medical Services you may have received from other than [facility] providers in the past  year (date may be approximate).     Assessment:    This is a routine wellness examination for Tracy Smith.  Hearing/Vision screen Hearing Screening - Comments:: Denies hearing difficulties   Vision Screening - Comments:: Wears rx glasses - up to date with routine eye exams   Goals Addressed             This Visit's Progress    Remain active and independent         Depression Screen     10/02/2023   11:31 AM 01/16/2023   12:43 PM 09/01/2020    7:20 AM  PHQ 2/9 Scores  PHQ - 2 Score 0 0 0  PHQ- 9 Score  0     Fall Risk     10/02/2023   11:32 AM 01/16/2023   12:43 PM 09/01/2020   12:17 PM  Fall Risk   Falls in the past year? 0 0 0  Number falls in past yr: 0 0 0  Injury with Fall? 0 0 0  Risk for fall due to : No Fall Risks No Fall Risks   Follow up Falls prevention discussed;Education provided;Falls evaluation completed Falls evaluation completed     MEDICARE RISK AT HOME:  Medicare Risk at Home Any stairs in or around the home?: Yes If so, are there any without handrails?: No Home free of loose throw rugs in walkways, pet beds, electrical cords, etc?: Yes Adequate lighting in your home to reduce risk of falls?: Yes Life alert?: No Use of a cane, walker or w/c?: No Grab bars in  the bathroom?: No Shower chair or bench in shower?: No Elevated toilet seat or a handicapped toilet?: No  TIMED UP AND GO:  Was the test performed?  No  Cognitive Function: 6CIT completed        10/02/2023   11:33 AM  6CIT Screen  What Year? 0 points  What month? 0 points  What time? 0 points  Count back from 20 0 points  Months in reverse 0 points  Repeat phrase 0 points  Total Score 0 points    Immunizations Immunization History  Administered Date(s) Administered   Influenza,inj,Quad PF,6+ Mos 03/07/2019   PFIZER(Purple Top)SARS-COV-2 Vaccination 07/24/2019, 08/18/2019, 03/05/2020   PNEUMOCOCCAL CONJUGATE-20 01/16/2023   Tdap 04/27/2014    Screening Tests Health Maintenance  Topic Date Due   Zoster Vaccines- Shingrix (1 of 2) Never done   INFLUENZA VACCINE  12/14/2023   DTaP/Tdap/Td (2 - Td or Tdap) 04/27/2024   Medicare Annual Wellness (AWV)  10/01/2024   Colonoscopy  12/14/2027   Pneumonia Vaccine 28+ Years old  Completed   Hepatitis C Screening  Completed   HIV Screening  Completed   HPV VACCINES  Aged Out   Meningococcal B Vaccine  Aged Out   COVID-19 Vaccine  Discontinued    Health Maintenance  Health Maintenance Due  Topic Date Due   Zoster Vaccines- Shingrix (1 of 2) Never done   Health Maintenance Items Addressed: Information provided on shingrix   Additional Screening:  Vision Screening: Recommended annual ophthalmology exams for early detection of glaucoma and other disorders of the eye.  Dental Screening: Recommended annual dental exams for proper oral hygiene  Community Resource Referral / Chronic Care Management: CRR required this visit?  No   CCM required this visit?  No   Plan:    I have personally reviewed and noted the following in the patient's chart:   Medical and social history Use of alcohol, tobacco  or illicit drugs  Current medications and supplements including opioid prescriptions. Patient is not currently taking  opioid prescriptions. Functional ability and status Nutritional status Physical activity Advanced directives List of other physicians Hospitalizations, surgeries, and ER visits in previous 12 months Vitals Screenings to include cognitive, depression, and falls Referrals and appointments  In addition, I have reviewed and discussed with patient certain preventive protocols, quality metrics, and best practice recommendations. A written personalized care plan for preventive services as well as general preventive health recommendations were provided to patient.   Tracy Smith, [place]   1/61/0960   After Visit Summary: (MyChart) Due to this being a telephonic visit, the after visit summary with patients personalized plan was offered to patient via MyChart   Notes: Nothing significant to report at this time.

## 2023-10-05 ENCOUNTER — Ambulatory Visit (INDEPENDENT_AMBULATORY_CARE_PROVIDER_SITE_OTHER): Admitting: Behavioral Health

## 2023-10-05 ENCOUNTER — Encounter: Payer: Self-pay | Admitting: Behavioral Health

## 2023-10-05 VITALS — BP 142/83 | HR 80 | Ht 68.5 in | Wt 220.0 lb

## 2023-10-05 DIAGNOSIS — F99 Mental disorder, not otherwise specified: Secondary | ICD-10-CM

## 2023-10-05 DIAGNOSIS — F411 Generalized anxiety disorder: Secondary | ICD-10-CM

## 2023-10-05 DIAGNOSIS — F5105 Insomnia due to other mental disorder: Secondary | ICD-10-CM | POA: Diagnosis not present

## 2023-10-05 DIAGNOSIS — F331 Major depressive disorder, recurrent, moderate: Secondary | ICD-10-CM

## 2023-10-05 MED ORDER — QUETIAPINE FUMARATE 25 MG PO TABS: 25.0000 mg | ORAL_TABLET | Freq: Every day | ORAL | 1 refills | Status: DC

## 2023-10-05 MED ORDER — ESCITALOPRAM OXALATE 10 MG PO TABS: 10.0000 mg | ORAL_TABLET | Freq: Every day | ORAL | 1 refills | Status: DC

## 2023-10-05 NOTE — Progress Notes (Signed)
 [facility] MD/PA/NP Initial Note  10/05/2023 4:26 PM Tracy Smith  MRN:  [id number]  Chief Complaint:  Chief Complaint   Depression; Anxiety; Establish Care; Patient Education; Stress     HPI:   "Taino", 68 year old male presents to this office for initial visit and to establish care.  Collateral information should be considered reliable.  He is very friendly and cooperative.  Patient is very talkative and it is sometimes difficult to redirect.  Reports lots of social dynamic changes in his life including recent retirement and chronic illness with his spouse.  He is currently receiving psychotherapy from Dr. Gutterman.  Says that he does not have any close friends or anyone that he can confide or talk to.  He becomes tearful when discussing having to adjust to a nonstructured life after retirement.  Reports challenging relationships with one of his daughters and her husband.  Says that his relationship with his wife is strained due to her current condition and other chronic illness.  He also has concern about heart health and recent diagnosis of arthrosclerosis.  Says that he is here today on referral because he is open to medication possibly helping with depression and anxiety.  He is reporting anxiety today at 5/10, and depression at 5/10.  He believes that some is situational that he is having a more difficult time coping.  Says that his sleep is often affected and waking up several times per night and has trouble falling asleep.  He is requesting medication that may help with this.  His PHQ 2 was score of 5.  His MDQ had 4 of the 14 criterion marked yes.  He endorses frequent irritability, requiring less sleep, racing thoughts at bedtime, and trouble concentrating.  Says that he still enjoys doing things and trying to stay active.  He is trying to walk and exercise more and be more attentive to his diet.  He denies history of mania, no psychosis, no auditory or visual hallucinations.  Denies  previous hospitalizations.  Follows up with PCP and other specialist regularly.  Patient states that he feels safe and verbally contracts for safety with this Clinical research associate.  Has strong family support from his spouse.  No prior psychiatric medication trials  Visit Diagnosis:    ICD-10-CM   1. Generalized anxiety disorder  F41.1 QUEtiapine (SEROQUEL) 25 MG tablet    escitalopram (LEXAPRO) 10 MG tablet    2. Major depressive disorder, recurrent episode, moderate (HCC)  F33.1 escitalopram (LEXAPRO) 10 MG tablet    3. Insomnia due to other mental disorder  F51.05 QUEtiapine (SEROQUEL) 25 MG tablet   F99       Past Psychiatric History: anxiety with depression  Past Medical History:  Past Medical History:  Diagnosis Date   Allergy    High cholesterol    Hypertension     Past Surgical History:  Procedure Laterality Date   CHOLECYSTECTOMY N/A 11/21/2016   Procedure: LAPAROSCOPIC CHOLECYSTECTOMY;  Surgeon: Dorena Gander, MD;  Location: [facility] OR;  Service: General;  Laterality: N/A;   COLONOSCOPY      Family Psychiatric History: none noted today  Family History:  Family History  Problem Relation Age of Onset   Heart attack Father    High blood pressure Father    Heart disease Father    Colon cancer Neg Hx    Colon polyps Neg Hx    Esophageal cancer Neg Hx    Rectal cancer Neg Hx    Stomach cancer Neg Hx  Social History:  Social History   Socioeconomic History   Marital status: Married    Spouse name: Doris   Number of children: 3   Years of education: 17   Highest education level: Bachelor's degree (e.g., BA, AB, BS)  Occupational History   Not on file  Tobacco Use   Smoking status: Some Days    Types: Cigars   Smokeless tobacco: Never  Vaping Use   Vaping status: Never Used  Substance and Sexual Activity   Alcohol use: Yes    Comment: occ   Drug use: No   Sexual activity: Yes  Other Topics Concern   Not on file  Social History Narrative   Lives with wife in  [place]. Enjoys watching and going to sporting events. Likes buying and selling    Sports cars.    Social Drivers of Corporate investment banker Strain: Low Risk  (10/02/2023)   Overall Financial Resource Strain (CARDIA)    Difficulty of Paying Living Expenses: Not hard at all  Food Insecurity: No Food Insecurity (10/02/2023)   Hunger Vital Sign    Worried About Running Out of Food in the Last Year: Never true    Ran Out of Food in the Last Year: Never true  Transportation Needs: No Transportation Needs (10/02/2023)   PRAPARE - Administrator, Civil Service (Medical): No    Lack of Transportation (Non-Medical): No  Physical Activity: Sufficiently Active (10/02/2023)   Exercise Vital Sign    Days of Exercise per Week: 5 days    Minutes of Exercise per Session: 30 min  Stress: No Stress Concern Present (10/02/2023)   [employer] of Occupational Health - Occupational Stress Questionnaire    Feeling of Stress : Not at all  Social Connections: Socially Integrated (10/02/2023)   Social Connection and Isolation Panel [NHANES]    Frequency of Communication with Friends and Family: More than three times a week    Frequency of Social Gatherings with Friends and Family: Three times a week    Attends Religious Services: More than 4 times per year    Active Member of Clubs or Organizations: Yes    Attends Banker Meetings: 1 to 4 times per year    Marital Status: Married    Allergies:  Allergies  Allergen Reactions   Bee Venom     swelling   No Known Allergies     Metabolic Disorder Labs: Lab Results  Component Value Date   HGBA1C 6.0 01/16/2023   No results found for: "PROLACTIN" Lab Results  Component Value Date   CHOL 130 07/12/2023   TRIG 103 07/12/2023   HDL 43 07/12/2023   CHOLHDL 3.0 07/12/2023   VLDL 16.1 01/16/2023   LDLCALC 68 07/12/2023   LDLCALC 85 01/16/2023   No results found for: "TSH"  Therapeutic Level Labs: No results  found for: "LITHIUM" No results found for: "VALPROATE" No results found for: "CBMZ"  Current Medications: Current Outpatient Medications  Medication Sig Dispense Refill   escitalopram (LEXAPRO) 10 MG tablet Take 1 tablet (10 mg total) by mouth daily. 30 tablet 1   QUEtiapine (SEROQUEL) 25 MG tablet Take 1 tablet (25 mg total) by mouth at bedtime. 30 tablet 1   amLODipine -benazepril  (LOTREL) 5-40 MG capsule Take 1 capsule by mouth daily. 90 capsule 3   aspirin  EC 81 MG tablet Take 1 tablet (81 mg total) by mouth every evening. 90 tablet 3   famotidine  (PEPCID ) 20 MG tablet  TAKE 1 TABLET BY MOUTH TWICE A DAY 180 tablet 1   montelukast  (SINGULAIR ) 10 MG tablet TAKE 1 TABLET BY MOUTH EVERY DAY IN THE EVENING 90 tablet 1   rosuvastatin  (CRESTOR ) 40 MG tablet Take 1 tablet (40 mg total) by mouth daily. 90 tablet 3   No current facility-administered medications for this visit.    Medication Side Effects: none  Orders placed this visit:  No orders of the defined types were placed in this encounter.   Psychiatric Specialty Exam:  Review of Systems  Constitutional: Negative.   Allergic/Immunologic: Negative.   Neurological: Negative.     Blood pressure (!) 142/83, pulse 80, height 5' 8.5" (1.74 m), weight 220 lb (99.8 kg).Body mass index is 32.96 kg/m.  General Appearance: Casual and Neat  Eye Contact:  Good  Speech:  Clear and Coherent  Volume:  Normal  Mood:  NA  Affect:  Appropriate  Thought Process:  Coherent  Orientation:  Full (Time, Place, and Person)  Thought Content: Logical   Suicidal Thoughts:  No  Homicidal Thoughts:  No  Memory:  WNL  Judgement:  Good  Insight:  Good  Psychomotor Activity:  Normal  Concentration:  Concentration: Good  Recall:  Good  Fund of Knowledge: Good  Language: Good  Assets:  Desire for Improvement  ADL's:  Intact  Cognition: WNL  Prognosis:  Good   Screenings:  PHQ2-9    Flowsheet Row Office Visit from 10/05/2023 in [facility]  [facility] Clinical Support from 10/02/2023 in [facility] at [facility] Office Visit from 01/16/2023 in [facility] at [facility] Office Visit from 09/01/2020 in [facility] at [facility]  PHQ-2 Total Score 2 0 0 0  PHQ-9 Total Score 5 -- 0 --       Receiving Psychotherapy: No   Treatment Plan/Recommendations:   Greater than 50% of 60 min  face to face time with patient was spent on counseling and coordination of care. We discussed his recent history with anxiety, depression and adjusting to wife changes.  We extensively discussed family dynamics and history stemming back to childhood.  We discussed his continued care with Dr. Gutterman.  We reviewed his medications and talked about possible medication options today along with side effect profiles.  He agreed today to: Will start Lexapro  10 mg daily after breakfast Will start Seroquel  25 mg at bedtime for sleep, racing thoughts, and anxiety. Will report side effects and worsening symptoms promptly Provided emergency contact information Will follow-up in 6 weeks to reassess Reviewed PDMP    Lincoln Renshaw, NP

## 2023-10-09 ENCOUNTER — Ambulatory Visit: Admitting: Cardiology

## 2023-10-22 ENCOUNTER — Ambulatory Visit (INDEPENDENT_AMBULATORY_CARE_PROVIDER_SITE_OTHER): Payer: Self-pay | Admitting: Psychology

## 2023-10-22 DIAGNOSIS — F4323 Adjustment disorder with mixed anxiety and depressed mood: Secondary | ICD-10-CM | POA: Diagnosis not present

## 2023-10-22 DIAGNOSIS — F411 Generalized anxiety disorder: Secondary | ICD-10-CM

## 2023-10-27 ENCOUNTER — Other Ambulatory Visit: Payer: Self-pay | Admitting: Behavioral Health

## 2023-10-27 DIAGNOSIS — F331 Major depressive disorder, recurrent, moderate: Secondary | ICD-10-CM

## 2023-10-27 DIAGNOSIS — F411 Generalized anxiety disorder: Secondary | ICD-10-CM

## 2023-10-27 DIAGNOSIS — F5105 Insomnia due to other mental disorder: Secondary | ICD-10-CM

## 2023-11-05 ENCOUNTER — Ambulatory Visit: Payer: Medicare Other | Admitting: Psychology

## 2023-11-05 DIAGNOSIS — F411 Generalized anxiety disorder: Secondary | ICD-10-CM

## 2023-11-05 NOTE — Progress Notes (Signed)
 [name], PhD   [facility] Behavioral Health Counselor Initial Adult Exam  Name: Tracy Smith Date: 11/05/2023 MRN: [id number] DOB: [birth year] PCP: Frann Mabel Mt, DO  Time Spent: 3:05  pm - 4:00 pm: 55 Minutes   [name] participated from home, via video, and consented to treatment. Therapist participated from home office. We met online due to COVID pandemic.   Guardian/Payee:  N/A    Paperwork requested: No   Reason for Visit /Presenting Problem: Anxiety and depression related to current life circumstances.  Mental Status Exam: Appearance:   Casual     Behavior:  Appropriate  Motor:  Normal  Speech/Language:   Normal Rate  Affect:  Appropriate  Mood:  depressed  Thought process:  normal  Thought content:    WNL  Sensory/Perceptual disturbances:    WNL  Orientation:  oriented to person, place, and situation  Attention:  Good  Concentration:  Good  Memory:  WNL  Fund of knowledge:   Good  Insight:    Good  Judgment:   Good  Impulse Control:  Good     Reported Symptoms:  Anxiety, sadness, frustration, agitation  Risk Assessment: Danger to Self:  No Self-injurious Behavior: No Danger to Others: No Duty to Warn:no Physical Aggression / Violence:No  Access to Firearms a concern: Yes  Gang Involvement:No  Patient / guardian was educated about steps to take if suicide or homicide risk level increases between visits: n/a While future psychiatric events cannot be accurately predicted, the patient does not currently require acute inpatient psychiatric care and does  not currently meet [place]  involuntary commitment criteria.  Substance Abuse History: Current substance abuse: No     Past Psychiatric History:   No previous psychological problems have been observed Outpatient Providers:Roderic Lammert, Ph.D. History of Psych Hospitalization: No  Psychological Testing: N/A   Abuse History:  Victim of: No., N/A   Report needed: No. Victim of Neglect:No. Perpetrator of N/A  Witness / Exposure to Domestic Violence: No   Protective Services Involvement: No  Witness to [employer] Violence:  No   Family History:  Family History  Problem Relation Age of Onset   Heart attack Father    High blood pressure Father    Heart disease Father    Colon cancer Neg Hx    Colon polyps Neg Hx    Esophageal  cancer Neg Hx    Rectal cancer Neg Hx    Stomach cancer Neg Hx     Living situation: the patient lives with their spouse  Sexual Orientation: Straight  Relationship Status: married  Name of spouse / other:[name] If a parent, number of children / ages:two adult biological daughters and one step-daughter.  Support Systems: N/A  Financial Stress:  No   Income/Employment/Disability: Architect: No   Educational History: Education: Risk manager: unknown  Any cultural differences that may affect / interfere with treatment:  not applicable   Recreation/Hobbies: exercise, shooting, cars  Stressors: Other: wife's illness, transition to retirement.     Strengths: Self Advocate  Barriers:  social isolation   Legal History: Pending legal issue / charges: The patient has no significant history of legal issues. History of legal issue / charges: N/A  Medical History/Surgical History: reviewed Past Medical History:  Diagnosis Date   Allergy    High cholesterol    Hypertension     Past Surgical History:  Procedure Laterality Date   CHOLECYSTECTOMY N/A 11/21/2016   Procedure:  LAPAROSCOPIC CHOLECYSTECTOMY;  Surgeon: Sebastian Moles, MD;  Location: [facility] OR;  Service: General;  Laterality: N/A;   COLONOSCOPY      Medications: Current Outpatient Medications  Medication Sig Dispense Refill   amLODipine -benazepril  (LOTREL) 5-40 MG capsule Take 1 capsule by mouth daily. 90 capsule 3   aspirin  EC 81 MG tablet Take 1 tablet (81 mg total) by mouth every evening. 90 tablet 3   escitalopram  (LEXAPRO ) 10 MG tablet Take 1 tablet (10 mg total) by mouth daily. 30 tablet 1   famotidine  (PEPCID ) 20 MG tablet TAKE 1 TABLET BY MOUTH TWICE A DAY 180 tablet 1   montelukast  (SINGULAIR ) 10 MG tablet TAKE 1 TABLET BY MOUTH EVERY DAY IN THE EVENING 90 tablet 1   QUEtiapine  (SEROQUEL ) 25 MG tablet Take 1 tablet (25 mg total) by mouth at bedtime. 30 tablet 1   rosuvastatin  (CRESTOR ) 40 MG tablet Take 1 tablet (40 mg total) by mouth daily. 90 tablet 3   No current facility-administered medications for this visit.    Allergies  Allergen Reactions   Bee Venom     swelling   No Known Allergies    Initial Session: Patient was last seen in 2022. He retired from [employer] in May. Wife had been retired for 4 years and he felt the timing was good to stop. Wife was having some cognitive problems and she has been diagnosed with early onset Alzheimer's. He said she is doing well and does mind exercises every day and is staying active. She will have more extensive testing in the coming weeks.  Also, Ashutosh's 52 year old mother, who lives in a retirement facility, woke up and was completely disoriented. They think she had a TIA. She is still able to be independent. He states that he is trying to adjust to retirement. He finds he has high anxiety and feels some depression. Tries to stay busy. His hope is to keep wife in the house as long as possible. His oldest daughter from first marriage is getting next Friday. She and boyfriend broke up but then got back together 8-9 months ago. Keshon wanted to talk with  the guy, but he hasn't reached out to [place]. She has not asked [place] to come to the destination wedding. His other daughter will not talk with him. He says he has depressive feeling with all of the negative  things happening in his life. Has few hobbies and few interests. Wife has 1 daughter with 2 kids and Shayn and his wife are involved. Elder is feeling bad that he is not being respected by family. He feels bad that he lashes out verbally at his wife. He is feeling very lonely and has not developed a social network. His days are not structured and he feels a little lost. The marital relationship is good, but is not sufficient to be fulfilling. His level of gratification is minimal. He says that he reflects on his life and questions what he has accomplished and is now looking at a future of taking care of his wife and losing his mother. He feels anger about what the future holds and is disappointed about what he is facing. This contributes to feeling anxious related to all of the uncertainty. Outlets are working out, house chores, hanging with grandchildren, gun range. These do not fill his time and are mostly individual activities. He does not drink and therefore doesn't go drinking with the guys.  He is seeking counseling to best learn how to navigate these difficult times. He wants strategies to have a more fulfilling and gratifying life.        Goals/Treatment Plan: Patient is seeking couseling to facilitate adjustment to numerous life changes, including wife's illness, mother's illness, retirement, strained relationships (daughters, brother). Seeking to reduce symptoms of both anxiety and depression. Does not want to consider medication. Will explore if necessary. Will utilize outpatient psychotherapy to explore FOO issues and develop appropriate behavioral strategies. Will also utilize insight oriented therapy. Revised goal date for resolution is 12-25. Diagnoses:  Adjustment Disorder with Anxiety and  Depression.  Plan of Care: Outpatient psychotherapy  Session notes: Jamason says that his wife has a new psychologist to help her deal with her illness. They did more testing of her memory. He says it has been 3 weeks on the medication and his sleep is much better. His aura ring shows he is sleeping better (from 5 1/2 hours to 7 hours). Father Day he did not hear from anyone and then on Tuesday he got a book from [employer]. It was Tuesday after Father's Day. Disappointing for him. Tries to lower expectation but still a little surprised at the lack to appreciation. Did not hear about Father's Day from his own kids. Talked about next steps.                                          [name], PhD  8:40a-9:30a 50 minutes

## 2023-11-12 NOTE — Progress Notes (Signed)
 Tracy ALM KERNS, PhD   [facility] Behavioral Health Counselor Initial Adult Exam  Name: Tracy Smith Date: 11/12/2023 MRN: [id number] DOB: 10-08-1955 PCP: Tracy Mabel Mt, DO  Time Spent: 3:05  pm - 4:00 pm: 55 Minutes   Tracy Smith participated from home, via video, and consented to treatment. Therapist participated from home office. We met online due to COVID pandemic.   Guardian/Payee:  N/A    Paperwork requested: No   Reason for Visit /Presenting Problem: Anxiety and depression related to current life circumstances.  Mental Status Exam: Appearance:   Casual     Behavior:  Appropriate  Motor:  Normal  Speech/Language:   Normal Rate  Affect:  Appropriate  Mood:  depressed  Thought process:  normal  Thought content:    WNL  Sensory/Perceptual disturbances:    WNL  Orientation:  oriented to person, place, and situation  Attention:  Good  Concentration:  Good  Memory:  WNL  Fund of knowledge:   Good  Insight:    Good  Judgment:   Good  Impulse Control:  Good     Reported Symptoms:  Anxiety, sadness, frustration, agitation  Risk Assessment: Danger to Self:  No Self-injurious Behavior: No Danger to Others: No Duty to Warn:no Physical Aggression / Violence:No  Access to Firearms a concern: Yes  Gang Involvement:No  Patient / guardian was educated about steps to take if suicide or homicide risk level increases between visits: n/a While future psychiatric events cannot be accurately predicted, the patient does not currently require acute inpatient psychiatric care and does  not currently meet [place]  involuntary commitment criteria.  Substance Abuse History: Current substance abuse: No     Past Psychiatric History:   No previous psychological problems have been observed Outpatient Providers:Tracy Smith, Ph.D. History of Psych Hospitalization: No  Psychological Testing: N/A   Abuse History:  Victim of: No., N/A   Report needed: No. Victim of Neglect:No. Perpetrator of N/A  Witness / Exposure to Domestic Violence: No   Protective Services Involvement: No  Witness to [employer] Violence:  No   Family History:  Family History  Problem Relation Age of Onset   Heart attack Father    High blood pressure Father    Heart disease Father    Colon cancer Neg Hx    Colon polyps Neg Hx    Esophageal  cancer Neg Hx    Rectal cancer Neg Hx    Stomach cancer Neg Hx     Living situation: the patient lives with their spouse  Sexual Orientation: Straight  Relationship Status: married  Name of spouse / other:Tracy Smith If a parent, number of children / ages:two adult biological daughters and one step-daughter.  Support Systems: N/A  Financial Stress:  No   Income/Employment/Disability: Architect: No   Educational History: Education: Risk manager: unknown  Any cultural differences that may affect / interfere with treatment:  not applicable   Recreation/Hobbies: exercise, shooting, cars  Stressors: Other: wife's illness, transition to retirement.     Strengths: Self Advocate  Barriers:  social isolation   Legal History: Pending legal issue / charges: The patient has no significant history of legal issues. History of legal issue / charges: N/A  Medical History/Surgical History: reviewed Past Medical History:  Diagnosis Date   Allergy    High cholesterol    Hypertension     Past Surgical History:  Procedure Laterality Date   CHOLECYSTECTOMY N/A 11/21/2016   Procedure:  LAPAROSCOPIC CHOLECYSTECTOMY;  Surgeon: Tracy Moles, MD;  Location: [facility];  Service: General;  Laterality: N/A;   COLONOSCOPY      Medications: Current Outpatient Medications  Medication Sig Dispense Refill   amLODipine -benazepril  (LOTREL) 5-40 MG capsule Take 1 capsule by mouth daily. 90 capsule 3   aspirin  EC 81 MG tablet Take 1 tablet (81 mg total) by mouth every evening. 90 tablet 3   escitalopram  (LEXAPRO ) 10 MG tablet Take 1 tablet (10 mg total) by mouth daily. 30 tablet 1   famotidine  (PEPCID ) 20 MG tablet TAKE 1 TABLET BY MOUTH TWICE A DAY 180 tablet 1   montelukast  (SINGULAIR ) 10 MG tablet TAKE 1 TABLET BY MOUTH EVERY DAY IN THE EVENING 90 tablet 1   QUEtiapine  (SEROQUEL ) 25 MG tablet Take 1 tablet (25 mg total) by mouth at bedtime. 30 tablet 1   rosuvastatin  (CRESTOR ) 40 MG tablet Take 1 tablet (40 mg total) by mouth daily. 90 tablet 3   No current facility-administered medications for this visit.    Allergies  Allergen Reactions   Bee Venom     swelling   No Known Allergies    Initial Session: Patient was last seen in 2022. He retired from [employer] in May. Wife had been retired for 4 years and he felt the timing was good to stop. Wife was having some cognitive problems and she has been diagnosed with early onset Alzheimer's. He said she is doing well and does mind exercises every day and is staying active. She will have more extensive testing in the coming weeks.  Also, Tracy Smith's 10 year old mother, who lives in a retirement facility, woke up and was completely disoriented. They think she had a TIA. She is still able to be independent. He states that he is trying to adjust to retirement. He finds he has high anxiety and feels some depression. Tries to stay busy. His hope is to keep wife in the house as long as possible. His oldest daughter from first marriage is getting next Friday. She and boyfriend broke up but then got back together 8-9 months ago. Tracy Smith wanted to talk with  the guy, but he hasn't reached out to [facility]. She has not asked [facility] Smith to come to the destination wedding. His other daughter will not talk with him. He says he has depressive feeling with all of the negative  things happening in his life. Has few hobbies and few interests. Wife has 1 daughter with 2 kids and Tracy Smith and his wife are involved. Tracy Smith is feeling bad that he is not being respected by family. He feels bad that he lashes out verbally at his wife. He is feeling very lonely and has not developed a social network. His days are not structured and he feels a little lost. The marital relationship is good, but is not sufficient to be fulfilling. His level of gratification is minimal. He says that he reflects on his life and questions what he has accomplished and is now looking at a future of taking care of his wife and losing his mother. He feels anger about what the future holds and is disappointed about what he is facing. This contributes to feeling anxious related to all of the uncertainty. Outlets are working out, house chores, hanging with grandchildren, gun range. These do not fill his time and are mostly individual activities. He does not drink and therefore doesn't go drinking with the guys.  He is seeking counseling to best learn how to navigate these difficult times. He wants strategies to have a more fulfilling and gratifying life.        Goals/Treatment Plan: Patient is seeking couseling to facilitate adjustment to numerous life changes, including wife's illness, mother's illness, retirement, strained relationships (daughters, brother). Seeking to reduce symptoms of both anxiety and depression. Does not want to consider medication. Will utilize outpatient psychotherapy to explore FOO issues and develop appropriate behavioral strategies. Will also utilize insight oriented therapy. Revised goal date for resolution is 12-25. Diagnoses:  Adjustment Disorder with Anxiety and Depression.  Plan of Care:  Outpatient psychotherapy  Session notes: Edwards saw Redell Smith on May 23rd. He was given a low dose of Lexapro  and Seroquel  for sleep. Says that the meds are helping him be less reactive or snappy.  Reports that his wife is seeing a therapist that is helping her cope with her medical condition. He chose not to go to [place]  and his brother gave him a hard time. We talked about putting this in perspective and not to get overly focused by his brother. Tiberius feels he is getting better at controlling his frustration and anger.                                           Tracy ALM KERNS, PhD 8:40a-9:30a 50 minutes

## 2023-11-14 ENCOUNTER — Ambulatory Visit: Admitting: Behavioral Health

## 2023-11-19 ENCOUNTER — Ambulatory Visit (INDEPENDENT_AMBULATORY_CARE_PROVIDER_SITE_OTHER): Admitting: Psychology

## 2023-11-19 DIAGNOSIS — F411 Generalized anxiety disorder: Secondary | ICD-10-CM

## 2023-11-19 DIAGNOSIS — F4323 Adjustment disorder with mixed anxiety and depressed mood: Secondary | ICD-10-CM

## 2023-11-19 NOTE — Progress Notes (Signed)
 [name], PhD   [facility] Behavioral Health Counselor Initial Adult Exam  Name: Tracy Smith Date: 11/19/2023 MRN: [id number] DOB: Oct 14, 1955 PCP: Frann Mabel Mt, DO  Time Spent: 3:05  pm - 4:00 pm: 55 Minutes   Tanvir A Wymer participated from home, via video, and consented to treatment. Therapist participated from home office. We met online due to COVID pandemic.   Guardian/Payee:  N/A    Paperwork requested: No   Reason for Visit /Presenting Problem: Anxiety and depression related to current life circumstances.  Mental Status Exam: Appearance:   Casual     Behavior:  Appropriate  Motor:  Normal  Speech/Language:   Normal Rate  Affect:  Appropriate  Mood:  depressed  Thought process:  normal  Thought content:    WNL  Sensory/Perceptual disturbances:    WNL  Orientation:  oriented to person, place, and situation  Attention:  Good  Concentration:  Good  Memory:  WNL  Fund of knowledge:   Good  Insight:    Good  Judgment:   Good  Impulse Control:  Good     Reported Symptoms:  Anxiety, sadness, frustration, agitation  Risk Assessment: Danger to Self:  No Self-injurious Behavior: No Danger to Others: No Duty to Warn:no Physical Aggression / Violence:No  Access to Firearms a concern: Yes  Gang Involvement:No  Patient / guardian was educated about steps to take if suicide or homicide risk level increases between visits: n/a While future psychiatric events cannot be accurately predicted, the patient does not currently require acute  inpatient psychiatric care and does not currently meet [place]  involuntary commitment criteria.  Substance Abuse History: Current substance abuse: No     Past Psychiatric History:   No previous psychological problems have been observed Outpatient Providers:Jerilynn Feldmeier, Ph.D. History of Psych Hospitalization: No  Psychological Testing: N/A   Abuse History:  Victim of: No., N/A   Report needed: No. Victim of Neglect:No. Perpetrator of N/A  Witness / Exposure to Domestic Violence: No   Protective Services Involvement: No  Witness to [employer] Violence:  No   Family History:  Family History  Problem Relation Age of Onset   Heart attack Father    High blood pressure Father    Heart disease Father  Colon cancer Neg Hx    Colon polyps Neg Hx    Esophageal cancer Neg Hx    Rectal cancer Neg Hx    Stomach cancer Neg Hx     Living situation: the patient lives with their spouse  Sexual Orientation: Straight  Relationship Status: married  Name of spouse / other:[name] If a parent, number of children / ages:two adult biological daughters and one step-daughter.  Support Systems: N/A  Financial Stress:  No   Income/Employment/Disability: Architect: No   Educational History: Education: Risk manager: unknown  Any cultural differences that may affect / interfere with treatment:  not applicable   Recreation/Hobbies: exercise, shooting, cars  Stressors: Other: wife's illness, transition to retirement.     Strengths: Self Advocate  Barriers:  social isolation   Legal History: Pending legal issue / charges: The patient has no significant history of legal issues. History of legal issue / charges: N/A  Medical History/Surgical History: reviewed Past Medical History:  Diagnosis Date   Allergy    High cholesterol    Hypertension     Past Surgical History:  Procedure Laterality Date   CHOLECYSTECTOMY  N/A 11/21/2016   Procedure: LAPAROSCOPIC CHOLECYSTECTOMY;  Surgeon: Sebastian Moles, MD;  Location: [facility] OR;  Service: General;  Laterality: N/A;   COLONOSCOPY      Medications: Current Outpatient Medications  Medication Sig Dispense Refill   amLODipine -benazepril  (LOTREL) 5-40 MG capsule Take 1 capsule by mouth daily. 90 capsule 3   aspirin  EC 81 MG tablet Take 1 tablet (81 mg total) by mouth every evening. 90 tablet 3   escitalopram  (LEXAPRO ) 10 MG tablet Take 1 tablet (10 mg total) by mouth daily. 30 tablet 1   famotidine  (PEPCID ) 20 MG tablet TAKE 1 TABLET BY MOUTH TWICE A DAY 180 tablet 1   montelukast  (SINGULAIR ) 10 MG tablet TAKE 1 TABLET BY MOUTH EVERY DAY IN THE EVENING 90 tablet 1   QUEtiapine  (SEROQUEL ) 25 MG tablet Take 1 tablet (25 mg total) by mouth at bedtime. 30 tablet 1   rosuvastatin  (CRESTOR ) 40 MG tablet Take 1 tablet (40 mg total) by mouth daily. 90 tablet 3   No current facility-administered medications for this visit.    Allergies  Allergen Reactions   Bee Venom     swelling   No Known Allergies    Initial Session: Patient was last seen in 2022. He retired from [employer] in May. Wife had been retired for 4 years and he felt the timing was good to stop. Wife was having some cognitive problems and she has been diagnosed with early onset Alzheimer's. He said she is doing well and does mind exercises every day and is staying active. She will have more extensive testing in the coming weeks.  Also, Connar's 66 year old mother, who lives in a retirement facility, woke up and was completely disoriented. They think she had a TIA. She is still able to be independent. He states that he is trying to adjust to retirement. He finds he has high anxiety and feels some depression. Tries to stay busy. His hope is to keep wife in the house as long as possible. His oldest daughter from first marriage is getting next Friday. She and boyfriend broke up but then got back together 8-9 months ago.  Notnamed wanted to talk with the guy, but he hasn't reached out to [place]. She has not asked [place] to come to the destination wedding. His other daughter will  not talk with him. He says he has depressive feeling with all of the negative things happening in his life. Has few hobbies and few interests. Wife has 1 daughter with 2 kids and Ramond and his wife are involved. Karry is feeling bad that he is not being respected by family. He feels bad that he lashes out verbally at his wife. He is feeling very lonely and has not developed a social network. His days are not structured and he feels a little lost. The marital relationship is good, but is not sufficient to be fulfilling. His level of gratification is minimal. He says that he reflects on his life and questions what he has accomplished and is now looking at a future of taking care of his wife and losing his mother. He feels anger about what the future holds and is disappointed about what he is facing. This contributes to feeling anxious related to all of the uncertainty. Outlets are working out, house chores, hanging with grandchildren, gun range. These do not fill his time and are mostly individual activities. He does not drink and therefore doesn't go drinking with the guys.  He is seeking counseling to best learn how to navigate these difficult times. He wants strategies to have a more fulfilling and gratifying life.        Goals/Treatment Plan: Patient is seeking couseling to facilitate adjustment to numerous life changes, including wife's illness, mother's illness, retirement, strained relationships (daughters, brother). Seeking to reduce symptoms of both anxiety and depression. Does not want to consider medication. Will explore if necessary. Will utilize outpatient psychotherapy to explore FOO issues and develop appropriate behavioral strategies. Will also utilize insight oriented therapy. Revised goal date for resolution is 12-25. Diagnoses:  Adjustment  Disorder with Anxiety and Depression.  Plan of Care: Outpatient psychotherapy  Session notes: Leanord says the medicine is helping him get better sleep. Has a follow-up visit with prescribing doctor on Friday. His brother has continued to reach out to him about their mother, who is not doing well. Talked about [name]'s 41 year old daughter and some of the disappointment they have with how disconnected she is from them. He feels like they get the grandchildren from [place] only because she needs them to help out. He is working well to create more balance in life and modulate his moods. No reported angry outbursts.                                             [name], PhD  8:40a-9:30a 50 minutes

## 2023-11-22 ENCOUNTER — Emergency Department (HOSPITAL_BASED_OUTPATIENT_CLINIC_OR_DEPARTMENT_OTHER)

## 2023-11-22 ENCOUNTER — Ambulatory Visit (HOSPITAL_COMMUNITY): Admitting: Certified Registered Nurse Anesthetist

## 2023-11-22 ENCOUNTER — Ambulatory Visit: Payer: Self-pay

## 2023-11-22 ENCOUNTER — Encounter (HOSPITAL_COMMUNITY)
Admission: EM | Disposition: A | Discharge status: Home / Self Care | Payer: Self-pay | Source: Ambulatory Visit | Attending: Emergency Medicine

## 2023-11-22 ENCOUNTER — Encounter (HOSPITAL_BASED_OUTPATIENT_CLINIC_OR_DEPARTMENT_OTHER): Payer: Self-pay

## 2023-11-22 ENCOUNTER — Other Ambulatory Visit: Payer: Self-pay

## 2023-11-22 ENCOUNTER — Ambulatory Visit (HOSPITAL_BASED_OUTPATIENT_CLINIC_OR_DEPARTMENT_OTHER)
Admission: EM | Admit: 2023-11-22 | Discharge: 2023-11-22 | Disposition: A | Discharge status: Home / Self Care | Source: Ambulatory Visit | Attending: Surgery | Admitting: Surgery

## 2023-11-22 DIAGNOSIS — I10 Essential (primary) hypertension: Secondary | ICD-10-CM | POA: Diagnosis not present

## 2023-11-22 DIAGNOSIS — E7800 Pure hypercholesterolemia, unspecified: Secondary | ICD-10-CM | POA: Insufficient documentation

## 2023-11-22 DIAGNOSIS — K3580 Unspecified acute appendicitis: Secondary | ICD-10-CM | POA: Insufficient documentation

## 2023-11-22 DIAGNOSIS — I25119 Atherosclerotic heart disease of native coronary artery with unspecified angina pectoris: Secondary | ICD-10-CM

## 2023-11-22 DIAGNOSIS — I2510 Atherosclerotic heart disease of native coronary artery without angina pectoris: Secondary | ICD-10-CM | POA: Diagnosis not present

## 2023-11-22 DIAGNOSIS — K353 Acute appendicitis with localized peritonitis, without perforation or gangrene: Secondary | ICD-10-CM | POA: Diagnosis not present

## 2023-11-22 DIAGNOSIS — K5730 Diverticulosis of large intestine without perforation or abscess without bleeding: Secondary | ICD-10-CM | POA: Diagnosis not present

## 2023-11-22 DIAGNOSIS — Z9049 Acquired absence of other specified parts of digestive tract: Secondary | ICD-10-CM | POA: Diagnosis not present

## 2023-11-22 DIAGNOSIS — F418 Other specified anxiety disorders: Secondary | ICD-10-CM | POA: Diagnosis not present

## 2023-11-22 DIAGNOSIS — F17290 Nicotine dependence, other tobacco product, uncomplicated: Secondary | ICD-10-CM | POA: Diagnosis not present

## 2023-11-22 DIAGNOSIS — K4020 Bilateral inguinal hernia, without obstruction or gangrene, not specified as recurrent: Secondary | ICD-10-CM | POA: Diagnosis not present

## 2023-11-22 HISTORY — PX: LAPAROSCOPIC APPENDECTOMY: SHX408

## 2023-11-22 LAB — COMPREHENSIVE METABOLIC PANEL WITH GFR
ALT: 28 U/L (ref 0–44)
AST: 26 U/L (ref 15–41)
Albumin: 4.6 g/dL (ref 3.5–5.0)
Alkaline Phosphatase: 60 U/L (ref 38–126)
Anion gap: 13 (ref 5–15)
BUN: 20 mg/dL (ref 8–23)
CO2: 25 mmol/L (ref 22–32)
Calcium: 9.3 mg/dL (ref 8.9–10.3)
Chloride: 102 mmol/L (ref 98–111)
Creatinine, Ser: 1.15 mg/dL (ref 0.61–1.24)
GFR, Estimated: >60 mL/min (ref >60)
Glucose, Bld: 123 mg/dL — ABNORMAL HIGH (ref 70–99)
Potassium: 4.5 mmol/L (ref 3.5–5.1)
Sodium: 140 mmol/L (ref 135–145)
Total Bilirubin: 1 mg/dL (ref 0.0–1.2)
Total Protein: 7.1 g/dL (ref 6.5–8.1)

## 2023-11-22 LAB — CBC WITH DIFFERENTIAL/PLATELET
Abs Immature Granulocytes: 0.06 K/uL (ref 0.00–0.07)
Basophils Absolute: 0 K/uL (ref 0.0–0.1)
Basophils Relative: 0 %
Eosinophils Absolute: 0.1 K/uL (ref 0.0–0.5)
Eosinophils Relative: 1 %
HCT: 45.7 % (ref 39.0–52.0)
Hemoglobin: 16.2 g/dL (ref 13.0–17.0)
Immature Granulocytes: 0 %
Lymphocytes Relative: 18 %
Lymphs Abs: 2.9 K/uL (ref 0.7–4.0)
MCH: 32 pg (ref 26.0–34.0)
MCHC: 35.4 g/dL (ref 30.0–36.0)
MCV: 90.3 fL (ref 80.0–100.0)
Monocytes Absolute: 1.4 K/uL — ABNORMAL HIGH (ref 0.1–1.0)
Monocytes Relative: 9 %
Neutro Abs: 11.4 K/uL — ABNORMAL HIGH (ref 1.7–7.7)
Neutrophils Relative %: 72 %
Platelets: 189 K/uL (ref 150–400)
RBC: 5.06 MIL/uL (ref 4.22–5.81)
RDW: 12.8 % (ref 11.5–15.5)
WBC: 15.8 K/uL — ABNORMAL HIGH (ref 4.0–10.5)
nRBC: 0 % (ref 0.0–0.2)

## 2023-11-22 LAB — URINALYSIS, W/ REFLEX TO CULTURE (INFECTION SUSPECTED)
Bilirubin Urine: NEGATIVE
Glucose, UA: NEGATIVE mg/dL
Ketones, ur: NEGATIVE mg/dL
Leukocytes,Ua: NEGATIVE
Nitrite: NEGATIVE
Protein, ur: NEGATIVE mg/dL
Specific Gravity, Urine: >=1.03 (ref 1.005–1.030)
pH: 5.5 (ref 5.0–8.0)

## 2023-11-22 SURGERY — APPENDECTOMY, LAPAROSCOPIC
Anesthesia: General

## 2023-11-22 MED ORDER — FENTANYL CITRATE (PF) 100 MCG/2ML IJ SOLN
INTRAMUSCULAR | Status: AC
  Filled 2023-11-22: qty 2

## 2023-11-22 MED ORDER — MIDAZOLAM HCL 2 MG/2ML IJ SOLN: 0.5000 mg | Freq: Once | INTRAMUSCULAR | Status: DC | PRN

## 2023-11-22 MED ORDER — IOHEXOL 300 MG/ML  SOLN
100.0000 mL | Freq: Once | INTRAMUSCULAR | Status: AC | PRN
  Administered 2023-11-22: 100 mL via INTRAVENOUS

## 2023-11-22 MED ORDER — LIDOCAINE HCL (PF) 2 % IJ SOLN
INTRAMUSCULAR | Status: AC
  Filled 2023-11-22: qty 20

## 2023-11-22 MED ORDER — LACTATED RINGERS IV SOLN: INTRAVENOUS | Status: DC

## 2023-11-22 MED ORDER — PHENYLEPHRINE 80 MCG/ML (10ML) SYRINGE FOR IV PUSH (FOR BLOOD PRESSURE SUPPORT)
PREFILLED_SYRINGE | INTRAVENOUS | Status: AC
  Filled 2023-11-22: qty 10

## 2023-11-22 MED ORDER — CEFTRIAXONE SODIUM 2 G IJ SOLR
2.0000 g | Freq: Once | INTRAMUSCULAR | Status: AC
  Administered 2023-11-22: 2 g via INTRAVENOUS
  Filled 2023-11-22: qty 20

## 2023-11-22 MED ORDER — OXYCODONE HCL 5 MG PO TABS
ORAL_TABLET | ORAL | Status: AC
  Filled 2023-11-22: qty 1

## 2023-11-22 MED ORDER — MIDAZOLAM HCL 2 MG/2ML IJ SOLN
INTRAMUSCULAR | Status: AC
  Filled 2023-11-22: qty 2

## 2023-11-22 MED ORDER — HYDROMORPHONE HCL 1 MG/ML IJ SOLN: 0.2500 mg | INTRAMUSCULAR | Status: DC | PRN

## 2023-11-22 MED ORDER — PROPOFOL 10 MG/ML IV BOLUS
INTRAVENOUS | Status: DC | PRN
Start: 2023-11-22 — End: 2023-11-22
  Administered 2023-11-22: 200 mg via INTRAVENOUS

## 2023-11-22 MED ORDER — PHENYLEPHRINE 80 MCG/ML (10ML) SYRINGE FOR IV PUSH (FOR BLOOD PRESSURE SUPPORT)
PREFILLED_SYRINGE | INTRAVENOUS | Status: DC | PRN
  Administered 2023-11-22 (×2): 80 ug via INTRAVENOUS

## 2023-11-22 MED ORDER — METRONIDAZOLE 500 MG/100ML IV SOLN
500.0000 mg | Freq: Once | INTRAVENOUS | Status: DC
  Filled 2023-11-22: qty 100

## 2023-11-22 MED ORDER — BUPIVACAINE HCL (PF) 0.5 % IJ SOLN
INTRAMUSCULAR | Status: DC | PRN
  Administered 2023-11-22: 20 mL

## 2023-11-22 MED ORDER — MIDAZOLAM HCL 2 MG/2ML IJ SOLN
INTRAMUSCULAR | Status: DC | PRN
Start: 2023-11-22 — End: 2023-11-22
  Administered 2023-11-22: 2 mg via INTRAVENOUS

## 2023-11-22 MED ORDER — ACETAMINOPHEN 500 MG PO TABS
1000.0000 mg | ORAL_TABLET | Freq: Once | ORAL | Status: AC
  Administered 2023-11-22: 1000 mg via ORAL
  Filled 2023-11-22: qty 2

## 2023-11-22 MED ORDER — FENTANYL CITRATE (PF) 250 MCG/5ML IJ SOLN
INTRAMUSCULAR | Status: DC | PRN
  Administered 2023-11-22 (×2): 50 ug via INTRAVENOUS

## 2023-11-22 MED ORDER — KETOROLAC TROMETHAMINE 30 MG/ML IJ SOLN
INTRAMUSCULAR | Status: DC | PRN
Start: 2023-11-22 — End: 2023-11-22
  Administered 2023-11-22: 30 mg via INTRAVENOUS

## 2023-11-22 MED ORDER — ACETAMINOPHEN 500 MG PO TABS
ORAL_TABLET | ORAL | Status: AC
  Filled 2023-11-22: qty 1

## 2023-11-22 MED ORDER — EPHEDRINE 5 MG/ML INJ
INTRAVENOUS | Status: AC
  Filled 2023-11-22: qty 5

## 2023-11-22 MED ORDER — EPHEDRINE SULFATE (PRESSORS) 50 MG/ML IJ SOLN
INTRAMUSCULAR | Status: DC | PRN
Start: 2023-11-22 — End: 2023-11-22
  Administered 2023-11-22: 5 mg via INTRAVENOUS
  Administered 2023-11-22: 10 mg via INTRAVENOUS
  Administered 2023-11-22: 5 mg via INTRAVENOUS

## 2023-11-22 MED ORDER — DEXAMETHASONE SODIUM PHOSPHATE 10 MG/ML IJ SOLN
INTRAMUSCULAR | Status: DC | PRN
  Administered 2023-11-22: 10 mg via INTRAVENOUS

## 2023-11-22 MED ORDER — ROCURONIUM BROMIDE 10 MG/ML (PF) SYRINGE
PREFILLED_SYRINGE | INTRAVENOUS | Status: DC | PRN
  Administered 2023-11-22: 60 mg via INTRAVENOUS

## 2023-11-22 MED ORDER — ROCURONIUM BROMIDE 10 MG/ML (PF) SYRINGE
PREFILLED_SYRINGE | INTRAVENOUS | Status: AC
  Filled 2023-11-22: qty 30

## 2023-11-22 MED ORDER — OXYCODONE HCL 5 MG PO TABS
5.0000 mg | ORAL_TABLET | Freq: Once | ORAL | Status: AC | PRN
  Administered 2023-11-22: 5 mg via ORAL

## 2023-11-22 MED ORDER — MEPERIDINE HCL 50 MG/ML IJ SOLN: 6.2500 mg | INTRAMUSCULAR | Status: DC | PRN

## 2023-11-22 MED ORDER — PROPOFOL 10 MG/ML IV BOLUS
INTRAVENOUS | Status: AC
Start: 2023-11-22 — End: 2023-11-22
  Filled 2023-11-22: qty 20

## 2023-11-22 MED ORDER — OXYCODONE HCL 5 MG/5ML PO SOLN: 5.0000 mg | Freq: Once | ORAL | Status: AC | PRN

## 2023-11-22 MED ORDER — OXYCODONE HCL 5 MG PO TABS: 5.0000 mg | ORAL_TABLET | Freq: Four times a day (QID) | ORAL | 0 refills | Status: DC | PRN

## 2023-11-22 MED ORDER — ONDANSETRON HCL 4 MG/2ML IJ SOLN
INTRAMUSCULAR | Status: DC | PRN
  Administered 2023-11-22: 4 mg via INTRAVENOUS

## 2023-11-22 MED ORDER — ONDANSETRON HCL 4 MG/2ML IJ SOLN
INTRAMUSCULAR | Status: AC
  Filled 2023-11-22: qty 8

## 2023-11-22 MED ORDER — DEXAMETHASONE SODIUM PHOSPHATE 10 MG/ML IJ SOLN
INTRAMUSCULAR | Status: AC
  Filled 2023-11-22: qty 4

## 2023-11-22 MED ORDER — KETOROLAC TROMETHAMINE 30 MG/ML IJ SOLN
INTRAMUSCULAR | Status: AC
  Filled 2023-11-22: qty 2

## 2023-11-22 MED ORDER — CHLORHEXIDINE GLUCONATE 0.12 % MT SOLN
15.0000 mL | Freq: Once | OROMUCOSAL | Status: AC
  Administered 2023-11-22: 15 mL via OROMUCOSAL

## 2023-11-22 MED ORDER — SUCCINYLCHOLINE CHLORIDE 200 MG/10ML IV SOSY
PREFILLED_SYRINGE | INTRAVENOUS | Status: AC
  Filled 2023-11-22: qty 20

## 2023-11-22 MED ORDER — LACTATED RINGERS IV BOLUS
1000.0000 mL | Freq: Once | INTRAVENOUS | Status: AC
  Administered 2023-11-22: 1000 mL via INTRAVENOUS

## 2023-11-22 MED ORDER — SUGAMMADEX SODIUM 200 MG/2ML IV SOLN
INTRAVENOUS | Status: DC | PRN
  Administered 2023-11-22: 400 mg via INTRAVENOUS

## 2023-11-22 MED ORDER — LACTATED RINGERS IR SOLN
Status: DC | PRN
  Administered 2023-11-22: 1000 mL

## 2023-11-22 MED ORDER — BUPIVACAINE HCL (PF) 0.5 % IJ SOLN
INTRAMUSCULAR | Status: AC
  Filled 2023-11-22: qty 30

## 2023-11-22 MED ORDER — LIDOCAINE HCL (PF) 2 % IJ SOLN
INTRAMUSCULAR | Status: DC | PRN
  Administered 2023-11-22: 20 mg via INTRADERMAL

## 2023-11-22 SURGICAL SUPPLY — 29 items
BAG COUNTER SPONGE SURGICOUNT (BAG) IMPLANT
CABLE HIGH FREQUENCY MONO STRZ (ELECTRODE) IMPLANT
CHLORAPREP W/TINT 26 (MISCELLANEOUS) ×1 IMPLANT
CLIP APPLIE 5 13 M/L LIGAMAX5 (MISCELLANEOUS) IMPLANT
COVER SURGICAL LIGHT HANDLE (MISCELLANEOUS) ×1 IMPLANT
CUTTER FLEX LINEAR 45M (STAPLE) IMPLANT
DERMABOND ADVANCED .7 DNX12 (GAUZE/BANDAGES/DRESSINGS) ×1 IMPLANT
ELECT REM PT RETURN 15FT ADLT (MISCELLANEOUS) ×1 IMPLANT
GLOVE BIO SURGEON STRL SZ7.5 (GLOVE) ×1 IMPLANT
GOWN STRL REUS W/ TWL XL LVL3 (GOWN DISPOSABLE) ×1 IMPLANT
IRRIGATION SUCT STRKRFLW 2 WTP (MISCELLANEOUS) ×1 IMPLANT
KIT BASIN OR (CUSTOM PROCEDURE TRAY) ×1 IMPLANT
KIT TURNOVER KIT A (KITS) ×1 IMPLANT
PENCIL SMOKE EVACUATOR (MISCELLANEOUS) IMPLANT
RELOAD 45 VASCULAR/THIN (ENDOMECHANICALS) IMPLANT
RELOAD STAPLE 45 2.5 WHT GRN (ENDOMECHANICALS) IMPLANT
RELOAD STAPLE 45 3.5 BLU ETS (ENDOMECHANICALS) IMPLANT
RELOAD STAPLE TA45 3.5 REG BLU (ENDOMECHANICALS) ×1 IMPLANT
SET TUBE SMOKE EVAC HIGH FLOW (TUBING) ×1 IMPLANT
SHEARS HARMONIC 36 ACE (MISCELLANEOUS) ×1 IMPLANT
SLEEVE Z-THREAD 5X100MM (TROCAR) ×1 IMPLANT
SPIKE FLUID TRANSFER (MISCELLANEOUS) ×1 IMPLANT
SUT MNCRL AB 4-0 PS2 18 (SUTURE) ×1 IMPLANT
SYSTEM BAG RETRIEVAL 10MM (BASKET) ×1 IMPLANT
TOWEL OR 17X26 10 PK STRL BLUE (TOWEL DISPOSABLE) ×1 IMPLANT
TRAY FOLEY MTR SLVR 16FR STAT (SET/KITS/TRAYS/PACK) IMPLANT
TRAY LAPAROSCOPIC (CUSTOM PROCEDURE TRAY) ×1 IMPLANT
TROCAR BALLN 12MMX100 BLUNT (TROCAR) ×1 IMPLANT
TROCAR Z-THREAD OPTICAL 5X100M (TROCAR) ×1 IMPLANT

## 2023-11-22 NOTE — Anesthesia Procedure Notes (Signed)
 Procedure Name: Intubation Date/Time: 11/22/2023 2:40 PM  Performed by: Cena Epps, CRNAPre-anesthesia Checklist: Patient identified, Emergency Drugs available, Suction available and Patient being monitored Patient Re-evaluated:Patient Re-evaluated prior to induction Oxygen Delivery Method: Circle System Utilized Preoxygenation: Pre-oxygenation with 100% oxygen Induction Type: IV induction Ventilation: Mask ventilation without difficulty Laryngoscope Size: Mac and 4 Grade View: Grade III Tube type: Oral Tube size: 7.5 mm Number of attempts: 1 Airway Equipment and Method: Stylet and Oral airway Placement Confirmation: positive ETCO2 and breath sounds checked- equal and bilateral Secured at: 23 cm Tube secured with: Tape Dental Injury: Teeth and Oropharynx as per pre-operative assessment

## 2023-11-22 NOTE — Transfer of Care (Signed)
 Immediate Anesthesia Transfer of Care Note  Patient: Tracy Smith  Procedure(s) Performed: APPENDECTOMY, LAPAROSCOPIC  Patient Location: PACU  Anesthesia Type:General  Level of Consciousness: awake  Airway & Oxygen Therapy: Patient Spontanous Breathing and Patient connected to face mask oxygen  Post-op Assessment: Report given to RN and Post -op Vital signs reviewed and stable  Post vital signs: Reviewed and stable  Last Vitals:  Vitals Value Taken Time  BP 141/90 11/22/23 15:34  Temp    Pulse 73 11/22/23 15:36  Resp 13 11/22/23 15:36  SpO2 98 % 11/22/23 15:36  Vitals shown include unfiled device data.  Last Pain:  Vitals:   11/22/23 1402  TempSrc: Oral  PainSc: 4          Complications: No notable events documented.

## 2023-11-22 NOTE — Anesthesia Preprocedure Evaluation (Addendum)
 Anesthesia Evaluation  Patient identified by MRN, date of birth, ID band Patient awake    Reviewed: Allergy & Precautions, NPO status , Patient's Chart, lab work & pertinent test results  History of Anesthesia Complications Negative for: history of anesthetic complications  Airway Mallampati: I  TM Distance: >3 FB Neck ROM: Full    Dental  (+) Dental Advisory Given   Pulmonary former smoker   breath sounds clear to auscultation       Cardiovascular hypertension, Pt. on medications (-) angina + CAD (coronary calcium  on CT)   Rhythm:Regular Rate:Normal  '23 Stress:  Stress EKG revealed no ischemic changes. Normal myocardial perfusion. Stress LVEF 67%. Low risk study. '23 ECHO: :  Left ventricle cavity is normal in size and wall thickness. Normal global  wall motion. Normal LV systolic function with EF 60%. Normal diastolic  filling pattern. Mild (Grade I) mitral regurgitation.     Neuro/Psych   Anxiety Depression       GI/Hepatic Neg liver ROS,GERD  Medicated and Controlled,,  Endo/Other  BMI 31  Renal/GU negative Renal ROS     Musculoskeletal   Abdominal   Peds  Hematology negative hematology ROS (+)   Anesthesia Other Findings   Reproductive/Obstetrics                              Anesthesia Physical Anesthesia Plan  ASA: 3  Anesthesia Plan: General   Post-op Pain Management: Tylenol  PO (pre-op)*   Induction: Intravenous  PONV Risk Score and Plan: 1 and Ondansetron  and Dexamethasone   Airway Management Planned: Oral ETT  Additional Equipment: None  Intra-op Plan:   Post-operative Plan: Extubation in OR  Informed Consent: I have reviewed the patients History and Physical, chart, labs and discussed the procedure including the risks, benefits and alternatives for the proposed anesthesia with the patient or authorized representative who has indicated his/her understanding  and acceptance.     Dental advisory given  Plan Discussed with: CRNA and Surgeon  Anesthesia Plan Comments:          Anesthesia Quick Evaluation

## 2023-11-22 NOTE — Anesthesia Postprocedure Evaluation (Signed)
 Anesthesia Post Note  Patient: Krystal LABOR Harsha  Procedure(s) Performed: APPENDECTOMY, LAPAROSCOPIC     Patient location during evaluation: Phase II Anesthesia Type: General Level of consciousness: awake and alert, oriented and patient cooperative Pain management: pain level controlled Vital Signs Assessment: post-procedure vital signs reviewed and stable Respiratory status: spontaneous breathing, nonlabored ventilation and respiratory function stable Cardiovascular status: blood pressure returned to baseline and stable Postop Assessment: no apparent nausea or vomiting and able to ambulate Anesthetic complications: no   No notable events documented.  Last Vitals:  Vitals:   11/22/23 1600 11/22/23 1609  BP: 120/69 128/74  Pulse: 70 70  Resp: 13 16  Temp: (!) 36.3 C (!) 36.4 C  SpO2: 93% 94%    Last Pain:  Vitals:   11/22/23 1609  TempSrc: Oral  PainSc:                  Yuvan Medinger,E. Jovanka Westgate

## 2023-11-22 NOTE — Telephone Encounter (Signed)
Disposition

## 2023-11-22 NOTE — H&P (Signed)
 Tracy Smith 10/13/1955  [id number].    Requesting MD: Benton Shone Chief Complaint/Reason for Consult: Abdominal Pain/ Acute Appendicitis   HPI: Tracy Smith is a 68 y.o. male past medical history significant for hypertension and high cholesterol who presents to [facility] ED with abdominal pain that started overnight and progressively got worse.  He describes a sharp pain in the right lower quadrant was worse with motion.  He had some nausea but no vomiting.  He is otherwise healthy without complaints.  He has had a previous cholecystectomy and has had no issues regarding general anesthesia.  He has no cardiopulmonary issues.  Past Medical History: As below Prior Abdominal Surgeries: Cholecystectomy in 2018 Blood Thinners: Takes baby aspirin  Last PO intake: Last night    ROS: Review of Systems  Constitutional:  Positive for chills and fever.  Gastrointestinal:  Positive for abdominal pain and nausea. Negative for vomiting.  All other systems reviewed and are negative.   Family History  Problem Relation Age of Onset   Heart attack Father    High blood pressure Father    Heart disease Father    Colon cancer Neg Hx    Colon polyps Neg Hx    Esophageal cancer Neg Hx    Rectal cancer Neg Hx    Stomach cancer Neg Hx     Past Medical History:  Diagnosis Date   Allergy    High cholesterol    Hypertension     Past Surgical History:  Procedure Laterality Date   CHOLECYSTECTOMY N/A 11/21/2016   Procedure: LAPAROSCOPIC CHOLECYSTECTOMY;  Surgeon: Sebastian Moles, MD;  Location: [facility] OR;  Service: General;  Laterality: N/A;   COLONOSCOPY      Social History:  reports that he has been smoking cigars. He has never used smokeless tobacco. He reports current alcohol use. He reports that he does not use drugs.  Allergies:  Allergies  Allergen Reactions   Bee Venom     swelling   No Known Allergies     (Not in a hospital admission)    Physical  Exam: Blood pressure 122/61, pulse 75, temperature 98.5 F (36.9 C), temperature source Oral, resp. rate 16, height 5' 8.5 (1.74 m), weight 94.3 kg, SpO2 97%. Physical Exam Constitutional:      Appearance: He is well-developed.  HENT:     Head: Normocephalic and atraumatic.  Eyes:     General: No scleral icterus. Cardiovascular:     Rate and Rhythm: Normal rate and regular rhythm.  Pulmonary:     Effort: Pulmonary effort is normal. No respiratory distress.  Abdominal:     Comments: Abdomen is soft.  He has multiple well-healed laparoscopic incisions.  He has moderate tenderness with guarding in the right lower quadrant  Skin:    General: Skin is warm and dry.  Neurological:     General: No focal deficit present.     Mental Status: He is alert.  Psychiatric:        Mood and Affect: Mood normal.        Behavior: Behavior normal.      Results for orders placed or performed during the hospital encounter of 11/22/23 (from the past 48 hours)  CBC with Differential/Platelet     Status: Abnormal   Collection Time: 11/22/23  9:19 AM  Result Value Ref Range   WBC 15.8 (H) 4.0 - 10.5 K/uL   RBC 5.06 4.22 - 5.81 MIL/uL   Hemoglobin 16.2 13.0 -  17.0 g/dL   HCT 54.2 60.9 - 47.9 %   MCV 90.3 80.0 - 100.0 fL   MCH 32.0 26.0 - 34.0 pg   MCHC 35.4 30.0 - 36.0 g/dL   RDW 87.1 88.4 - 84.4 %   Platelets 189 150 - 400 K/uL   nRBC 0.0 0.0 - 0.2 %   Neutrophils Relative % 72 %   Neutro Abs 11.4 (H) 1.7 - 7.7 K/uL   Lymphocytes Relative 18 %   Lymphs Abs 2.9 0.7 - 4.0 K/uL   Monocytes Relative 9 %   Monocytes Absolute 1.4 (H) 0.1 - 1.0 K/uL   Eosinophils Relative 1 %   Eosinophils Absolute 0.1 0.0 - 0.5 K/uL   Basophils Relative 0 %   Basophils Absolute 0.0 0.0 - 0.1 K/uL   Immature Granulocytes 0 %   Abs Immature Granulocytes 0.06 0.00 - 0.07 K/uL    Comment: Performed at [facility], [address] [place], [place] [address]  Comprehensive metabolic panel with GFR      Status: Abnormal   Collection Time: 11/22/23  9:19 AM  Result Value Ref Range   Sodium 140 135 - 145 mmol/L   Potassium 4.5 3.5 - 5.1 mmol/L   Chloride 102 98 - 111 mmol/L   CO2 25 22 - 32 mmol/L   Glucose, Bld 123 (H) 70 - 99 mg/dL    Comment: Glucose reference range applies only to samples taken after fasting for at least 8 hours.   BUN 20 8 - 23 mg/dL   Creatinine, Ser 8.84 0.61 - 1.24 mg/dL   Calcium  9.3 8.9 - 10.3 mg/dL   Total Protein 7.1 6.5 - 8.1 g/dL   Albumin 4.6 3.5 - 5.0 g/dL   AST 26 15 - 41 U/L   ALT 28 0 - 44 U/L   Alkaline Phosphatase 60 38 - 126 U/L   Total Bilirubin 1.0 0.0 - 1.2 mg/dL   GFR, Estimated >39 >39 mL/min    Comment: (NOTE) Calculated using the CKD-EPI Creatinine Equation (2021)    Anion gap 13 5 - 15    Comment: Performed at [facility], [address] [place], [place] [address]  Urinalysis, w/ Reflex to Culture (Infection Suspected) -Urine, Clean Catch     Status: Abnormal   Collection Time: 11/22/23  9:19 AM  Result Value Ref Range   Specimen Source URINE, CLEAN CATCH    Color, Urine YELLOW YELLOW   APPearance CLEAR CLEAR   Specific Gravity, Urine >=1.030 1.005 - 1.030   pH 5.5 5.0 - 8.0   Glucose, UA NEGATIVE NEGATIVE mg/dL   Hgb urine dipstick SMALL (A) NEGATIVE   Bilirubin Urine NEGATIVE NEGATIVE   Ketones, ur NEGATIVE NEGATIVE mg/dL   Protein, ur NEGATIVE NEGATIVE mg/dL   Nitrite NEGATIVE NEGATIVE   Leukocytes,Ua NEGATIVE NEGATIVE   Squamous Epithelial / HPF 0-5 0 - 5 /HPF   WBC, UA 0-5 0 - 5 WBC/hpf    Comment: Reflex urine culture not performed if WBC <=10, OR if Squamous epithelial cells >5. If Squamous epithelial cells >5, suggest recollection.   RBC / HPF 0-5 0 - 5 RBC/hpf   Bacteria, UA FEW (A) NONE SEEN    Comment: Performed at [facility], [address] [place], [place] [address]   CT ABDOMEN PELVIS W CONTRAST Result Date: 11/22/2023 CLINICAL DATA:  Right lower quadrant pain and nausea EXAM: CT  ABDOMEN AND PELVIS WITH CONTRAST TECHNIQUE: Multidetector CT  imaging of the abdomen and pelvis was performed using the standard protocol following bolus administration of intravenous contrast. RADIATION DOSE REDUCTION: This exam was performed according to the departmental dose-optimization program which includes automated exposure control, adjustment of the mA and/or kV according to patient size and/or use of iterative reconstruction technique. CONTRAST:  OMNIPAQUE  IOHEXOL  300 MG/ML  SOLN COMPARISON:  MRCP dated 11/17/2016 FINDINGS: Lower chest: No focal consolidation or pulmonary nodule in the lung bases. No pleural effusion or pneumothorax demonstrated. Partially imaged heart size is normal. Coronary artery calcifications. Hepatobiliary: Subcentimeter segment 6 hypodensity (9:127), too small to characterize but likely a cyst. No intra or extrahepatic biliary ductal dilation. Cholecystectomy. Pancreas: No focal lesions or main ductal dilation. Spleen: Normal in size without focal abnormality. Adrenals/Urinary Tract: No adrenal nodules. No suspicious renal mass, calculi or hydronephrosis. No focal bladder wall thickening. Stomach/Bowel: Normal appearance of the stomach. No evidence of bowel wall thickening, distention, or inflammatory changes. Sigmoid diverticulosis without acute diverticulitis. Mildly dilated appendix measures 10 mm in diameter and demonstrates circumferential mucosal hyperenhancement and mild mural thickening. Vascular/Lymphatic: Aortic atherosclerosis. No enlarged abdominal or pelvic lymph nodes. Reproductive: Prostate is unremarkable. Other: Mild right lower quadrant periappendiceal stranding. No free air or fluid collection. Musculoskeletal: No acute or abnormal lytic or blastic osseous lesions. Multilevel degenerative changes of the partially imaged thoracic and lumbar spine. Small fat-containing bilateral inguinal hernias. Left gluteal intramuscular lipoma measures 2.2 x 1.1 cm (4:102).  IMPRESSION: 1. Acute uncomplicated appendicitis. 2. Aortic Atherosclerosis (ICD10-I70.0). Coronary artery calcifications. Assessment for potential risk factor modification, dietary therapy or pharmacologic therapy may be warranted, if clinically indicated. Electronically Signed   By: Limin  Xu M.D.   On: 11/22/2023 11:14    Anti-infectives (From admission, onward)    Start     Dose/Rate Route Frequency Ordered Stop   11/22/23 1130  cefTRIAXone  (ROCEPHIN ) 2 g in sodium chloride  0.9 % 100 mL IVPB       Placed in And Linked Group   2 g 200 mL/hr over 30 Minutes Intravenous  Once 11/22/23 1129     11/22/23 1130  metroNIDAZOLE  (FLAGYL ) IVPB 500 mg  Status:  Discontinued       Placed in And Linked Group   500 mg 100 mL/hr over 60 Minutes Intravenous  Once 11/22/23 1129 11/22/23 1153       Assessment/Plan Acute Appendicitis   I have reviewed the CAT scan of the abdomen pelvis and I discussed the diagnosis of appendicitis with the patient.  We discussed conservative management versus surgery regarding appendicitis.  I would recommend a laparoscopic appendectomy.  I explained the surgical procedure in detail.  We discussed the risk which includes but is not limited to bleeding, infection, injury to surrounding structures, appendiceal stump leak, the need to convert to an open procedure, cardiopulmonary issues with anesthesia, postop recovery, etc.  We also discussed the potential to discharge from the recovery room if there is no evidence of perforation of the appendix.  After thorough discussion, he understands and agrees with the plans.  Surgery is scheduled  Moderate medical decision making     Vicenta Poli, MD [facility] 11/22/2023, 11:57 AM Please see Amion for pager number during day hours 7:00am-4:30pm

## 2023-11-22 NOTE — Op Note (Signed)
 Appendectomy, Lap, Procedure Note  Indications: The patient presented with a history of right-sided abdominal pain. A CT revealed findings consistent with acute appendicitis.  Pre-operative Diagnosis: acute appendicitis  Post-operative Diagnosis: same  Surgeon: Vicenta Poli   Assistants: none  Anesthesia: General endotracheal anesthesia  ASA Class: 2  Procedure Details  The patient was seen again in the Holding Room. The risks, benefits, complications, treatment options, and expected outcomes were discussed with the patient and/or family. The possibilities of reaction to medication, perforation of viscus, bleeding, recurrent infection, finding a normal appendix, the need for additional procedures, failure to diagnose a condition, and creating a complication requiring transfusion or operation were discussed. There was concurrence with the proposed plan and informed consent was obtained. The site of surgery was properly noted. The patient was taken to Operating Room, identified as Tracy Smith and the procedure verified as Appendectomy. A Time Out was held and the above information confirmed.  The patient was placed in the supine position and general anesthesia was induced, along with placement of orogastric tube, Venodyne boots, and a Foley catheter. The abdomen was prepped and draped in a sterile fashion. A infraumbilical incision was made.  The the midline fascia was incised with a #15 blade.  A Kelly clamp was used to confirm entrance into the peritoneal cavity.  A pursestring suture was passed around the incision with a 0 Vicryl.  The Hasson was introduced into the abdomen and the tails of the suture were used to hold the Hasson in place.   The pneumoperitoneum was then established to steady pressure of 15 mmHg.  Additional 5 mm cannulas then placed in the left lower quadrant of the abdomen and the right upper quadrant under direct visualization. A careful evaluation of the entire  abdomen was carried out. The patient was placed in Trendelenburg and left lateral decubitus position. The small intestines were retracted in the cephalad and left lateral direction away from the pelvis and right lower quadrant. The patient was found to have an enlarged and inflamed appendix.  The base of the appendix was normal.   There was no evidence of perforation.  The appendix was carefully dissected. The appendix was was skeletonized with the harmonic scalpel.   The appendix was divided at its base using an endo-GIA stapler. There was no evidence of bleeding, leakage, or complication after division of the appendix. The appendix was then placed in an endosac and removed through the umbilical incision.  Irrigation was also performed and irrigate suctioned from the abdomen as well.  All ports were removed under direct vision.  The umbilical port site was closed with the purse string suture. The trocar site skin wounds were closed with 4-0 Monocryl.  Instrument, sponge, and needle counts were correct at the conclusion of the case.   Findings: The appendix was found to be inflamed. There were not signs of necrosis.  There was not perforation. There was not abscess formation.  Estimated Blood Loss:  Minimal                 Complications:  None; patient tolerated the procedure well.         Disposition: PACU - hemodynamically stable.         Condition: stable

## 2023-11-22 NOTE — Discharge Instructions (Addendum)
[facility] ______[facility] LAPAROSCOPIC SURGERY: POST OP INSTRUCTIONS Always review your discharge instruction sheet given to you by the facility where your surgery was performed. IF YOU HAVE DISABILITY OR FAMILY LEAVE FORMS, YOU MUST BRING THEM TO THE OFFICE FOR PROCESSING.   DO NOT GIVE THEM TO YOUR DOCTOR.  A prescription for pain medication may be given to you upon discharge.  Take your pain medication as prescribed, if needed.  If narcotic pain medicine is not needed, then you may take acetaminophen (Tylenol) or ibuprofen (Advil) as needed. Take your usually prescribed medications unless otherwise directed. If you need a refill on your pain medication, please contact your pharmacy.  They will contact our office to request authorization. Prescriptions will not be filled after 5pm or on week-ends. You should follow a light diet the first few days after arrival home, such as soup and crackers, etc.  Be sure to include lots of fluids daily. Most patients will experience some swelling and bruising in the area of the incisions.  Ice packs will help.  Swelling and bruising can take several days to resolve.  It is common to experience some constipation if taking pain medication after surgery.  Increasing fluid intake and taking a stool softener (such as Colace) will usually help or prevent this problem from occurring.  A mild laxative (Milk of Magnesia or Miralax) should be taken according to package instructions if there are no bowel movements after 48 hours. Unless discharge instructions indicate otherwise, you may remove your bandages 24-48 hours after surgery, and you may shower at that time.  You may have steri-strips (small skin tapes) in place directly over the incision.  These strips should be left on the skin for 7-10 days.  If your surgeon used skin glue on the incision, you may shower in 24 hours.  The glue will flake off over the next 2-3 weeks.  Any sutures or staples will be  removed at the office during your follow-up visit. ACTIVITIES:  You may resume regular (light) daily activities beginning the next day--such as daily self-care, walking, climbing stairs--gradually increasing activities as tolerated.  You may have sexual intercourse when it is comfortable.  Refrain from any heavy lifting or straining until approved by your doctor. You may drive when you are no longer taking prescription pain medication, you can comfortably wear a seatbelt, and you can safely maneuver your car and apply brakes. RETURN TO WORK:  __________________________________________________________ You should see your doctor in the office for a follow-up appointment approximately 2-3 weeks after your surgery.  Make sure that you call for this appointment within a day or two after you arrive home to insure a convenient appointment time. OTHER INSTRUCTIONS: YOU MAY SHOWER STARTING TOMORROW ICE PACK, TYLENOL, AND IBUPROFEN ALSO FOR PAIN NO LIFTING MORE THAN 15 POUNDS FOR 2 WEEKS __________________________________________________________________________________________________________________________ __________________________________________________________________________________________________________________________ WHEN TO CALL YOUR DOCTOR: Fever over 101.0 Inability to urinate Continued bleeding from incision. Increased pain, redness, or drainage from the incision. Increasing abdominal pain  The clinic staff is available to answer your questions during regular business hours.  Please don't hesitate to call and ask to speak to one of the nurses for clinical concerns.  If you have a medical emergency, go to the nearest emergency room or call 911.  A surgeon from [facility] is always on call at the hospital. [address], [address], [place], [place]  [address] ? [address] [address], [place], [place]   [address] [phone] ? [phone] ?   FAX [phone] Web site:  [email or web address] 

## 2023-11-22 NOTE — ED Triage Notes (Signed)
 Pt presents with RLQ abdominal pain that started yesterday. Tender to touch. Also endorses nausea, fever and chills yesterday. Reports feeling gassy and excessive burping

## 2023-11-22 NOTE — Telephone Encounter (Signed)
  FYI Only or Action Required?: FYI only for provider.  Patient was last seen in primary care on 01/16/2023 by Frann Mabel Mt, DO.  Called Nurse Triage reporting Abdominal Pain.  Symptoms began yesterday.  Interventions attempted: Nothing.  Symptoms are: gradually worsening.  Triage Disposition: No disposition on file.  Patient/caregiver understands and will follow disposition?:    Copied from CRM [id number]. Topic: Clinical - Red Word Triage >> Nov 22, 2023  7:34 AM [name] wrote: Red Word that prompted transfer to Nurse Triage:   Symptoms started last night Lower abd pain on right side Chills Fever Pain increases touch the area Had gallbladder surgery several years ago Reason for Disposition  [1] SEVERE pain AND [2] age > 60 years  Answer Assessment - Initial Assessment Questions 1. LOCATION: Where does it hurt?      Waist line 2. RADIATION: Does the pain shoot anywhere else? (e.g., chest, back)     denies 3. ONSET: When did the pain begin? (Minutes, hours or days ago)      Last night, states ate a light dinner and kept getting worse. 8-10/10 4. SUDDEN: Gradual or sudden onset?     gradual 5. PATTERN Does the pain come and go, or is it constant?     constant 6. SEVERITY: How bad is the pain?  (e.g., Scale 1-10; mild, moderate, or severe)     8/10 7. RECURRENT SYMPTOM: Have you ever had this type of stomach pain before? If Yes, ask: When was the last time? and What happened that time?      no 8. CAUSE: What do you think is causing the stomach pain? (e.g., gallstones, recent abdominal surgery)     unknown 9. RELIEVING/AGGRAVATING FACTORS: What makes it better or worse? (e.g., antacids, bending or twisting motion, bowel movement)     Laying down straight, but pain is still there. 10. OTHER SYMPTOMS: Do you have any other symptoms? (e.g., back pain, diarrhea, fever, urination pain, vomiting)       Chills, fever, abd pain, nausea, burping a  lot  Protocols used: Abdominal Pain - Male-A-AH

## 2023-11-22 NOTE — ED Provider Notes (Signed)
 [name] Provider Note   CSN: [id number] Arrival date & time: 11/22/23  9151     Patient presents with: Abdominal Pain   Tracy Smith is a 68 y.o. male.   Patient is a 68 year old male with a history of hypertension hyperlipidemia status postcholecystectomy who is presenting today with less than 24 hours of abdominal pain which has been persistent.  He reports it started last night and has gradually worsened.  Any type of movement makes the pain worse.  The pain is in the right lower quadrant.  He has had some nausea and anorexia but denies any vomiting.  No change in bowel movements or urinary habits.  He is also felt chills and feverish.  The history is provided by the patient.  Abdominal Pain      Prior to Admission medications   Medication Sig Start Date End Date Taking? Authorizing Provider  amLODipine -benazepril  (LOTREL) 5-40 MG capsule Take 1 capsule by mouth daily. 09/25/23   Patwardhan, Newman PARAS, MD  aspirin  EC 81 MG tablet Take 1 tablet (81 mg total) by mouth every evening. 09/25/23   Patwardhan, Newman PARAS, MD  escitalopram  (LEXAPRO ) 10 MG tablet Take 1 tablet (10 mg total) by mouth daily. 10/05/23   Teresa Redell DELENA, NP  famotidine  (PEPCID ) 20 MG tablet TAKE 1 TABLET BY MOUTH TWICE A DAY 07/02/23   Frann Mabel Mt, DO  montelukast  (SINGULAIR ) 10 MG tablet TAKE 1 TABLET BY MOUTH EVERY DAY IN THE EVENING 09/17/23   Wendling, Mabel Mt, DO  QUEtiapine  (SEROQUEL ) 25 MG tablet Take 1 tablet (25 mg total) by mouth at bedtime. 10/05/23   Teresa Redell DELENA, NP  rosuvastatin  (CRESTOR ) 40 MG tablet Take 1 tablet (40 mg total) by mouth daily. 09/25/23   Patwardhan, Newman PARAS, MD    Allergies: Bee venom and No known allergies    Review of Systems  Gastrointestinal:  Positive for abdominal pain.    Updated Vital Signs BP (!) 142/83   Pulse 70   Temp 98.5 F (36.9 C) (Oral)   Resp 18   Ht 5' 8.5 (1.74 m)   Wt 94.3 kg   SpO2  95%   BMI 31.17 kg/m   Physical Exam Vitals and nursing note reviewed.  Constitutional:      General: He is not in acute distress.    Appearance: He is well-developed.  HENT:     Head: Normocephalic and atraumatic.  Eyes:     Conjunctiva/sclera: Conjunctivae normal.     Pupils: Pupils are equal, round, and reactive to light.  Cardiovascular:     Rate and Rhythm: Normal rate and regular rhythm.     Pulses: Normal pulses.     Heart sounds: No murmur heard. Pulmonary:     Effort: Pulmonary effort is normal. No respiratory distress.     Breath sounds: Normal breath sounds. No wheezing or rales.  Abdominal:     General: There is no distension.     Palpations: Abdomen is soft.     Tenderness: There is abdominal tenderness in the right lower quadrant. There is guarding. There is no right CVA tenderness, left CVA tenderness or rebound.  Musculoskeletal:        General: No tenderness. Normal range of motion.     Cervical back: Normal range of motion and neck supple.  Skin:    General: Skin is warm and dry.     Findings: No erythema or rash.  Neurological:  Mental Status: He is alert and oriented to person, place, and time. Mental status is at baseline.  Psychiatric:        Behavior: Behavior normal.     (all labs ordered are listed, but only abnormal results are displayed) Labs Reviewed  CBC WITH DIFFERENTIAL/PLATELET - Abnormal; Notable for the following components:      Result Value   WBC 15.8 (*)    Neutro Abs 11.4 (*)    Monocytes Absolute 1.4 (*)    All other components within normal limits  COMPREHENSIVE METABOLIC PANEL WITH GFR - Abnormal; Notable for the following components:   Glucose, Bld 123 (*)    All other components within normal limits  URINALYSIS, W/ REFLEX TO CULTURE (INFECTION SUSPECTED) - Abnormal; Notable for the following components:   Hgb urine dipstick SMALL (*)    Bacteria, UA FEW (*)    All other components within normal limits     EKG: None  Radiology: CT ABDOMEN PELVIS W CONTRAST Result Date: 11/22/2023 CLINICAL DATA:  Right lower quadrant pain and nausea EXAM: CT ABDOMEN AND PELVIS WITH CONTRAST TECHNIQUE: Multidetector CT imaging of the abdomen and pelvis was performed using the standard protocol following bolus administration of intravenous contrast. RADIATION DOSE REDUCTION: This exam was performed according to the departmental dose-optimization program which includes automated exposure control, adjustment of the mA and/or kV according to patient size and/or use of iterative reconstruction technique. CONTRAST:  OMNIPAQUE  IOHEXOL  300 MG/ML  SOLN COMPARISON:  MRCP dated 11/17/2016 FINDINGS: Lower chest: No focal consolidation or pulmonary nodule in the lung bases. No pleural effusion or pneumothorax demonstrated. Partially imaged heart size is normal. Coronary artery calcifications. Hepatobiliary: Subcentimeter segment 6 hypodensity (9:127), too small to characterize but likely a cyst. No intra or extrahepatic biliary ductal dilation. Cholecystectomy. Pancreas: No focal lesions or main ductal dilation. Spleen: Normal in size without focal abnormality. Adrenals/Urinary Tract: No adrenal nodules. No suspicious renal mass, calculi or hydronephrosis. No focal bladder wall thickening. Stomach/Bowel: Normal appearance of the stomach. No evidence of bowel wall thickening, distention, or inflammatory changes. Sigmoid diverticulosis without acute diverticulitis. Mildly dilated appendix measures 10 mm in diameter and demonstrates circumferential mucosal hyperenhancement and mild mural thickening. Vascular/Lymphatic: Aortic atherosclerosis. No enlarged abdominal or pelvic lymph nodes. Reproductive: Prostate is unremarkable. Other: Mild right lower quadrant periappendiceal stranding. No free air or fluid collection. Musculoskeletal: No acute or abnormal lytic or blastic osseous lesions. Multilevel degenerative changes of the partially  imaged thoracic and lumbar spine. Small fat-containing bilateral inguinal hernias. Left gluteal intramuscular lipoma measures 2.2 x 1.1 cm (4:102). IMPRESSION: 1. Acute uncomplicated appendicitis. 2. Aortic Atherosclerosis (ICD10-I70.0). Coronary artery calcifications. Assessment for potential risk factor modification, dietary therapy or pharmacologic therapy may be warranted, if clinically indicated. Electronically Signed   By: Limin  Xu M.D.   On: 11/22/2023 11:14     Procedures   Medications Ordered in the ED  cefTRIAXone  (ROCEPHIN ) 2 g in sodium chloride  0.9 % 100 mL IVPB (2 g Intravenous New Bag/Given 11/22/23 1135)  lactated ringers  bolus 1,000 mL (0 mLs Intravenous Stopped 11/22/23 1108)  iohexol  (OMNIPAQUE ) 300 MG/ML solution 100 mL (100 mLs Intravenous Contrast Given 11/22/23 1044)                                    Medical Decision Making Amount and/or Complexity of Data Reviewed Labs: ordered. Decision-making details documented in ED Course. Radiology: ordered and  independent interpretation performed. Decision-making details documented in ED Course.  Risk Prescription drug management.   Pt with multiple medical problems and comorbidities and presenting today with a complaint that caries a high risk for morbidity and mortality.  Here today with complaint of abdominal pain.  Concern for appendicitis, renal stone, diverticulitis.  Lower suspicion for perforation, incarcerated hernia, no findings to suggest zoster.  No history to suggest pyelonephritis.  Labs and imaging are pending. I independently interpreted patient's labs and CBC with a leukocytosis of 15 with stable hemoglobin, CMP without acute findings, UA without acute findings. I have independently visualized and interpreted pt's images today.  CT today with concerns for appendicitis.  Radiology reports acute uncomplicated appendicitis.  Consulted general surgery Patient was given Rocephin  and then transferred by private  vehicle to short stay at [facility].     Final diagnoses:  Acute appendicitis with localized peritonitis, without perforation, abscess, or gangrene    ED Discharge Orders     None          Doretha Folks, MD 11/22/23 1154

## 2023-11-23 ENCOUNTER — Encounter (HOSPITAL_COMMUNITY): Payer: Self-pay | Admitting: Surgery

## 2023-11-23 ENCOUNTER — Telehealth (INDEPENDENT_AMBULATORY_CARE_PROVIDER_SITE_OTHER): Admitting: Behavioral Health

## 2023-11-23 DIAGNOSIS — F331 Major depressive disorder, recurrent, moderate: Secondary | ICD-10-CM

## 2023-11-23 DIAGNOSIS — F5105 Insomnia due to other mental disorder: Secondary | ICD-10-CM | POA: Diagnosis not present

## 2023-11-23 DIAGNOSIS — F99 Mental disorder, not otherwise specified: Secondary | ICD-10-CM | POA: Diagnosis not present

## 2023-11-23 DIAGNOSIS — F411 Generalized anxiety disorder: Secondary | ICD-10-CM

## 2023-11-23 MED ORDER — ESCITALOPRAM OXALATE 10 MG PO TABS: 10.0000 mg | ORAL_TABLET | Freq: Every day | ORAL | 2 refills | Status: DC

## 2023-11-23 MED ORDER — QUETIAPINE FUMARATE 25 MG PO TABS
25.0000 mg | ORAL_TABLET | Freq: Every day | ORAL | 2 refills | Status: DC
Start: 2023-11-23 — End: 2024-01-18

## 2023-11-23 NOTE — Progress Notes (Signed)
 Tracy Smith [id number] [birth year] 68 y.o.  Virtual Visit via Video Note  I connected with pt @ on 11/23/23 at  2:30 PM EDT by a video enabled telemedicine application and verified that I am speaking with the correct person using two identifiers.   I discussed the limitations of evaluation and management by telemedicine and the availability of in person appointments. The patient expressed understanding and agreed to proceed.  I discussed the assessment and treatment plan with the patient. The patient was provided an opportunity to ask questions and all were answered. The patient agreed with the plan and demonstrated an understanding of the instructions.   The patient was advised to call back or seek an in-person evaluation if the symptoms worsen or if the condition fails to improve as anticipated.  I provided 30 minutes of non-face-to-face time during this encounter.  The patient was located at home.  The provider was located at [facility].   Redell DELENA Pizza, NP   Subjective:   Patient ID:  Tracy Smith is a 68 y.o. (DOB 03-Nov-1955) male.  Chief Complaint: No chief complaint on file.   HPI [name], 68 year old male presents to this office via video visit for follow up and medication management. Collateral information should be considered reliable.  He is very friendly and cooperative.  Says that medications are working well. Feels much more since of calm. Not been as reactive with spouse.  Says his wife has noticed improvement.  Had emergency appendectomy yesterday and home healing Requesting no need for medication change or adjustment today.  Says anxiety today is 2/10, and depression 2/10. Is sleeping a little better with aid of Seroquel .  Appetite is good. He denies history of mania, no psychosis, no auditory or visual hallucinations.  Denies previous hospitalizations.  Follows up with PCP and other specialist regularly.  Patient states that he feels safe and verbally  contracts for safety with this Clinical research associate.  Has strong family support from his spouse.   No prior psychiatric medication trials      Review of Systems:  Review of Systems  Constitutional: Negative.   Allergic/Immunologic: Negative.   Neurological: Negative.   Psychiatric/Behavioral:  Positive for dysphoric mood.     Medications: I have reviewed the patient's current medications.  Current Outpatient Medications  Medication Sig Dispense Refill   amLODipine -benazepril  (LOTREL) 5-40 MG capsule Take 1 capsule by mouth daily. 90 capsule 3   aspirin  EC 81 MG tablet Take 1 tablet (81 mg total) by mouth every evening. 90 tablet 3   escitalopram  (LEXAPRO ) 10 MG tablet Take 1 tablet (10 mg total) by mouth daily. 30 tablet 1   famotidine  (PEPCID ) 20 MG tablet TAKE 1 TABLET BY MOUTH TWICE A DAY 180 tablet 1   montelukast  (SINGULAIR ) 10 MG tablet TAKE 1 TABLET BY MOUTH EVERY DAY IN THE EVENING 90 tablet 1   oxyCODONE  (OXY IR/ROXICODONE ) 5 MG immediate release tablet Take 1 tablet (5 mg total) by mouth every 6 (six) hours as needed for moderate pain (pain score 4-6) or severe pain (pain score 7-10). 25 tablet 0   QUEtiapine  (SEROQUEL ) 25 MG tablet Take 1 tablet (25 mg total) by mouth at bedtime. 30 tablet 1   rosuvastatin  (CRESTOR ) 40 MG tablet Take 1 tablet (40 mg total) by mouth daily. 90 tablet 3   No current facility-administered medications for this visit.    Medication Side Effects: None  Allergies:  Allergies  Allergen Reactions   Bee Venom  swelling   No Known Allergies     Past Medical History:  Diagnosis Date   Allergy    High cholesterol    Hypertension     Family History  Problem Relation Age of Onset   Heart attack Father    High blood pressure Father    Heart disease Father    Colon cancer Neg Hx    Colon polyps Neg Hx    Esophageal cancer Neg Hx    Rectal cancer Neg Hx    Stomach cancer Neg Hx     Social History   Socioeconomic History   Marital status:  Married    Spouse name: Doris   Number of children: 3   Years of education: 17   Highest education level: Bachelor's degree (e.g., BA, AB, BS)  Occupational History   Not on file  Tobacco Use   Smoking status: Some Days    Types: Cigars   Smokeless tobacco: Never  Vaping Use   Vaping status: Never Used  Substance and Sexual Activity   Alcohol use: Yes    Comment: occ   Drug use: No   Sexual activity: Yes  Other Topics Concern   Not on file  Social History Narrative   Lives with wife in [place]. Enjoys watching and going to sporting events. Likes buying and selling    Sports cars.    Social Drivers of Corporate investment banker Strain: Low Risk  (10/02/2023)   Overall Financial Resource Strain (CARDIA)    Difficulty of Paying Living Expenses: Not hard at all  Food Insecurity: No Food Insecurity (10/02/2023)   Hunger Vital Sign    Worried About Running Out of Food in the Last Year: Never true    Ran Out of Food in the Last Year: Never true  Transportation Needs: No Transportation Needs (10/02/2023)   PRAPARE - Administrator, Civil Service (Medical): No    Lack of Transportation (Non-Medical): No  Physical Activity: Sufficiently Active (10/02/2023)   Exercise Vital Sign    Days of Exercise per Week: 5 days    Minutes of Exercise per Session: 30 min  Stress: No Stress Concern Present (10/02/2023)   [employer] of Occupational Health - Occupational Stress Questionnaire    Feeling of Stress : Not at all  Social Connections: Socially Integrated (10/02/2023)   Social Connection and Isolation Panel    Frequency of Communication with Friends and Family: More than three times a week    Frequency of Social Gatherings with Friends and Family: Three times a week    Attends Religious Services: More than 4 times per year    Active Member of Clubs or Organizations: Yes    Attends Banker Meetings: 1 to 4 times per year    Marital Status: Married   Catering manager Violence: Not At Risk (10/02/2023)   Humiliation, Afraid, Rape, and Kick questionnaire    Fear of Current or Ex-Partner: No    Emotionally Abused: No    Physically Abused: No    Sexually Abused: No    Past Medical History, Surgical history, Social history, and Family history were reviewed and updated as appropriate.   Please see review of systems for further details on the patient's review from today.   Objective:   Physical Exam:  There were no vitals taken for this visit.  Physical Exam Constitutional:      General: He is not in acute distress.    Appearance: Normal  appearance.  Neurological:     Mental Status: He is alert and oriented to person, place, and time.     Gait: Gait normal.  Psychiatric:        Attention and Perception: Attention and perception normal. He does not perceive auditory or visual hallucinations.        Mood and Affect: Mood and affect normal. Mood is not anxious or depressed. Affect is not labile.        Speech: Speech normal.        Behavior: Behavior normal. Behavior is cooperative.        Thought Content: Thought content normal.        Cognition and Memory: Cognition and memory normal.        Judgment: Judgment normal.     Lab Review:     Component Value Date/Time   NA 140 11/22/2023 0919   K 4.5 11/22/2023 0919   CL 102 11/22/2023 0919   CO2 25 11/22/2023 0919   GLUCOSE 123 (H) 11/22/2023 0919   BUN 20 11/22/2023 0919   CREATININE 1.15 11/22/2023 0919   CALCIUM  9.3 11/22/2023 0919   PROT 7.1 11/22/2023 0919   ALBUMIN 4.6 11/22/2023 0919   AST 26 11/22/2023 0919   ALT 28 11/22/2023 0919   ALKPHOS 60 11/22/2023 0919   BILITOT 1.0 11/22/2023 0919   GFRNONAA >60 11/22/2023 0919   GFRAA >60 11/21/2016 0536       Component Value Date/Time   WBC 15.8 (H) 11/22/2023 0919   RBC 5.06 11/22/2023 0919   HGB 16.2 11/22/2023 0919   HCT 45.7 11/22/2023 0919   PLT 189 11/22/2023 0919   MCV 90.3 11/22/2023 0919   MCH 32.0  11/22/2023 0919   MCHC 35.4 11/22/2023 0919   RDW 12.8 11/22/2023 0919   LYMPHSABS 2.9 11/22/2023 0919   MONOABS 1.4 (H) 11/22/2023 0919   EOSABS 0.1 11/22/2023 0919   BASOSABS 0.0 11/22/2023 0919    No results found for: POCLITH, LITHIUM   No results found for: PHENYTOIN, PHENOBARB, VALPROATE, CBMZ   .res Assessment: Plan:    Greater than 50% of 30 min  video visit  time with patient was spent on counseling and coordination of care. We discussed his good stability since last visit. Also discussed his recent emergency appendectomy yesterday.   He agreed today to:  To continue Lexapro  10 mg daily after breakfast To continue Seroquel  25 mg at bedtime for sleep, racing thoughts, and anxiety. Will report side effects and worsening symptoms promptly Provided emergency contact information Will follow-up in 8 weeks to reassess Reviewed PDMP    Diagnoses and all orders for this visit:  Generalized anxiety disorder  Major depressive disorder, recurrent episode, moderate (HCC)  Insomnia due to other mental disorder     Please see After Visit Summary for patient specific instructions.  Future Appointments  Date Time Provider Department Center  12/03/2023  8:30 AM Coni Alm CROME, PhD [facility] None  12/17/2023  8:30 AM Coni Alm CROME, PhD [facility] None  10/07/2024 11:30 AM [facility] ANNUAL WELLNESS VISIT 2 [facility]    No orders of the defined types were placed in this encounter.     -------------------------------

## 2023-11-26 LAB — SURGICAL PATHOLOGY

## 2023-11-27 ENCOUNTER — Encounter: Payer: Self-pay | Admitting: Family Medicine

## 2023-12-03 ENCOUNTER — Ambulatory Visit (INDEPENDENT_AMBULATORY_CARE_PROVIDER_SITE_OTHER): Admitting: Psychology

## 2023-12-03 DIAGNOSIS — F4323 Adjustment disorder with mixed anxiety and depressed mood: Secondary | ICD-10-CM

## 2023-12-03 DIAGNOSIS — F411 Generalized anxiety disorder: Secondary | ICD-10-CM

## 2023-12-03 NOTE — Telephone Encounter (Signed)
 Disposition? On the bottom it says, to ER.

## 2023-12-03 NOTE — Progress Notes (Signed)
 [name], PhD   [facility] Behavioral Health Counselor Initial Adult Exam  Name: Tracy Smith Date: 12/03/2023 MRN: [id number] DOB: [birth year] PCP: Frann Mabel Mt, DO  Time Spent: 3:05  pm - 4:00 pm: 55 Minutes   Boubacar A Mckibben participated from home, via video, and consented to treatment. Therapist participated from home office. We met online due to COVID pandemic.   Guardian/Payee:  N/A    Paperwork requested: No   Reason for Visit /Presenting Problem: Anxiety and depression related to current life circumstances.  Mental Status Exam: Appearance:   Casual     Behavior:  Appropriate  Motor:  Normal  Speech/Language:   Normal Rate  Affect:  Appropriate  Mood:  depressed  Thought process:  normal  Thought content:    WNL  Sensory/Perceptual disturbances:    WNL  Orientation:  oriented to person, place, and situation  Attention:  Good  Concentration:  Good  Memory:  WNL  Fund of knowledge:   Good  Insight:    Good  Judgment:   Good  Impulse Control:  Good     Reported Symptoms:  Anxiety, sadness, frustration, agitation  Risk Assessment: Danger to Self:  No Self-injurious Behavior: No Danger to Others: No Duty to Warn:no Physical Aggression / Violence:No  Access to Firearms a concern: Yes  Gang Involvement:No  Patient / guardian was educated about steps to take if suicide or homicide risk level increases between visits: n/a While future psychiatric events cannot be accurately predicted, the patient does not  currently require acute inpatient psychiatric care and does not currently meet [facility]  involuntary commitment criteria.  Substance Abuse History: Current substance abuse: No     Past Psychiatric History:   No previous psychological problems have been observed Outpatient Providers:Steven Basso, Ph.D. History of Psych Hospitalization: No  Psychological Testing: N/A   Abuse History:  Victim of: No., N/A   Report needed: No. Victim of Neglect:No. Perpetrator of N/A  Witness / Exposure to Domestic Violence: No   Protective Services Involvement: No  Witness to [employer] Violence:  No   Family History:  Family History  Problem Relation Age of Onset   Heart attack Father  High blood pressure Father    Heart disease Father    Colon cancer Neg Hx    Colon polyps Neg Hx    Esophageal cancer Neg Hx    Rectal cancer Neg Hx    Stomach cancer Neg Hx     Living situation: the patient lives with their spouse  Sexual Orientation: Straight  Relationship Status: married  Name of spouse / other:Tracy Smith If a parent, number of children / ages:two adult biological daughters and one step-daughter.  Support Systems: N/A  Financial Stress:  No   Income/Employment/Disability: Architect: No   Educational History: Education: Risk manager: unknown  Any cultural differences that may affect / interfere with treatment:  not applicable   Recreation/Hobbies: exercise, shooting, cars  Stressors: Other: wife's illness, transition to retirement.     Strengths: Self Advocate  Barriers:  social isolation   Legal History: Pending legal issue / charges: The patient has no significant history of legal issues. History of legal issue / charges: N/A  Medical History/Surgical History: reviewed Past Medical History:  Diagnosis Date   Allergy    High cholesterol    Hypertension     Past Surgical History:  Procedure Laterality  Date   CHOLECYSTECTOMY N/A 11/21/2016   Procedure: LAPAROSCOPIC CHOLECYSTECTOMY;  Surgeon: Sebastian Moles, MD;  Location: [facility] OR;  Service: General;  Laterality: N/A;   COLONOSCOPY     LAPAROSCOPIC APPENDECTOMY N/A 11/22/2023   Procedure: APPENDECTOMY, LAPAROSCOPIC;  Surgeon: Vernetta Berg, MD;  Location: [facility] ORS;  Service: General;  Laterality: N/A;    Medications: Current Outpatient Medications  Medication Sig Dispense Refill   amLODipine -benazepril  (LOTREL) 5-40 MG capsule Take 1 capsule by mouth daily. 90 capsule 3   aspirin  EC 81 MG tablet Take 1 tablet (81 mg total) by mouth every evening. 90 tablet 3   escitalopram  (LEXAPRO ) 10 MG tablet Take 1 tablet (10 mg total) by mouth daily. 30 tablet 2   famotidine  (PEPCID ) 20 MG tablet TAKE 1 TABLET BY MOUTH TWICE A DAY 180 tablet 1   montelukast  (SINGULAIR ) 10 MG tablet TAKE 1 TABLET BY MOUTH EVERY DAY IN THE EVENING 90 tablet 1   oxyCODONE  (OXY IR/ROXICODONE ) 5 MG immediate release tablet Take 1 tablet (5 mg total) by mouth every 6 (six) hours as needed for moderate pain (pain score 4-6) or severe pain (pain score 7-10). 25 tablet 0   QUEtiapine  (SEROQUEL ) 25 MG tablet Take 1 tablet (25 mg total) by mouth at bedtime. 30 tablet 2   rosuvastatin  (CRESTOR ) 40 MG tablet Take 1 tablet (40 mg total) by mouth daily. 90 tablet 3   No current facility-administered medications for this visit.    Allergies  Allergen Reactions   Bee Venom     swelling   No Known Allergies    Initial Session: Patient was last seen in 2022. He retired from [employer] in May. Wife had been retired for 4 years and he felt the timing was good to stop. Wife was having some cognitive problems and she has been diagnosed with early onset Alzheimer's. He said she is doing well and does mind exercises every day and is staying active. She will have more extensive testing in the coming weeks.  Also, Glenville's 62 year old mother, who lives in a retirement facility, woke up and was  completely disoriented. They think she had a TIA. She is still able to be independent. He states that he is trying to adjust to retirement. He  finds he has high anxiety and feels some depression. Tries to stay busy. His hope is to keep wife in the house as long as possible. His oldest daughter from first marriage is getting next Friday. She and boyfriend broke up but then got back together 8-9 months ago. Rudolfo wanted to talk with the guy, but he hasn't reached out to [place]. She has not asked Pope to come to the destination wedding. His other daughter will not talk with him. He says he has depressive feeling with all of the negative things happening in his life. Has few hobbies and few interests. Wife has 1 daughter with 2 kids and Tequan and his wife are involved. Craig is feeling bad that he is not being respected by family. He feels bad that he lashes out verbally at his wife. He is feeling very lonely and has not developed a social network. His days are not structured and he feels a little lost. The marital relationship is good, but is not sufficient to be fulfilling. His level of gratification is minimal. He says that he reflects on his life and questions what he has accomplished and is now looking at a future of taking care of his wife and losing his mother. He feels anger about what the future holds and is disappointed about what he is facing. This contributes to feeling anxious related to all of the uncertainty. Outlets are working out, house chores, hanging with grandchildren, gun range. These do not fill his time and are mostly individual activities. He does not drink and therefore doesn't go drinking with the guys.  He is seeking counseling to best learn how to navigate these difficult times. He wants strategies to have a more fulfilling and gratifying life.        Goals/Treatment Plan: Patient is seeking couseling to facilitate adjustment to numerous life changes, including wife's illness, mother's  illness, retirement, strained relationships (daughters, brother). Seeking to reduce symptoms of both anxiety and depression. Does not want to consider medication. Will explore if necessary. Will utilize outpatient psychotherapy to explore FOO issues and develop appropriate behavioral strategies. Will also utilize insight oriented therapy. Revised goal date for resolution is 12-25. Diagnoses:  Adjustment Disorder with Anxiety and Depression.  Plan of Care: Outpatient psychotherapy  Session notes: O[place]ale says he had a medical scare last week. He had pain in lower right quadrant. Went to the ER and had CT scan. Told he had appendicitis and had appendix taken out on Thursday. He is grateful that he responded quickly to the pain. He says that he and Graig had a little conflict on Friday when Alan took Ladera out for her birthday. She wanted Gaylen to go and he did not want to impose on their time together. He wanted his daughter to have quality time with [facility]. He still has feelings about daughter's lack of responsiveness to his birthday. His kids do not know he had surgery. He felt  he did not want to tell them. Likely protecting himself from being hurt or disappointed again. He wonders what he did what was so bad that they no longer have any communication. Says that he is sleeping better than before and the medication is helping him have more restful sleep.  [name], PhD  8:40a-9:30a 50 minutes

## 2023-12-17 ENCOUNTER — Ambulatory Visit (INDEPENDENT_AMBULATORY_CARE_PROVIDER_SITE_OTHER): Admitting: Psychology

## 2023-12-17 DIAGNOSIS — F4323 Adjustment disorder with mixed anxiety and depressed mood: Secondary | ICD-10-CM | POA: Diagnosis not present

## 2023-12-17 DIAGNOSIS — F411 Generalized anxiety disorder: Secondary | ICD-10-CM

## 2023-12-17 NOTE — Progress Notes (Signed)
 [name], PhD   [facility] Behavioral Health Counselor [place] Adult Exam  Name: Tracy Smith Date: 12/17/2023 MRN: [id number] DOB: [birth year] [place] PCP: Frann Mabel Mt, DO  Time Spent: 3:05  pm - 4:00 pm: 55 Minutes   Tracy A Levan participated from home, via video, and consented to treatment. Therapist participated from home office. We met online due to COVID pandemic.   Guardian/Payee:  N/A    Paperwork requested: No   Reason for Visit /Presenting Problem: Anxiety and depression related to current life circumstances.  Mental Status Exam: Appearance:   Casual     Behavior:  Appropriate  Motor:  Normal  Speech/Language:   Normal Rate  Affect:  Appropriate  Mood:  depressed  Thought process:  normal  Thought content:    WNL  Sensory/Perceptual disturbances:    WNL  Orientation:  oriented to person, place, and situation  Attention:  Good  Concentration:  Good  Memory:  WNL  Fund of knowledge:   Good  Insight:    Good  Judgment:   Good  Impulse Control:  Good     Reported Symptoms:  Anxiety, sadness, frustration, agitation  Risk Assessment: Danger to Self:  No Self-injurious Behavior: No Danger to Others: No Duty to Warn:no Physical Aggression / Violence:No  Access to Firearms a concern: Yes  Gang Involvement:No  Patient / guardian was educated about steps to take if suicide or homicide risk level increases between visits: n/a While future psychiatric events cannot be accurately  predicted, the patient does not currently require acute inpatient psychiatric care and does not currently meet [facility]  involuntary commitment criteria.  Substance Abuse History: Current substance abuse: No     Past Psychiatric History:   No previous psychological problems have been observed Outpatient Providers:Hopelynn Gartland, Ph.D. History of Psych Hospitalization: No  Psychological Testing: N/A   Abuse History:  Victim of: No., N/A   Report needed: No. Victim of Neglect:No. Perpetrator of N/A  Witness / Exposure to Domestic Violence: No   Protective Services Involvement: No  Witness to [employer] Violence:  No   Family History:  Family History  Problem Relation Age of Onset   Heart attack Father    High blood pressure Father    Heart disease Father    Colon cancer Neg Hx    Colon polyps Neg Hx    Esophageal cancer Neg Hx    Rectal cancer Neg Hx    Stomach cancer Neg Hx     Living situation: the patient lives with their spouse  Sexual Orientation: Straight  Relationship Status: married  Name of spouse / other:Tracy Smith If a parent, number of children / ages:two adult biological daughters and one step-daughter.  Support Systems: N/A  Financial Stress:  No   Income/Employment/Disability: Architect: No   Educational History: Education: Risk manager: unknown  Any cultural differences that may affect / interfere with treatment:  not applicable   Recreation/Hobbies: exercise, shooting, cars  Stressors: Other: wife's illness, transition to retirement.     Strengths: Self Advocate  Barriers:  social isolation   Legal History: Pending legal issue / charges: The patient has no significant history of legal issues. History of legal issue / charges: N/A  Medical History/Surgical History: reviewed Past Medical History:  Diagnosis Date   Allergy    High cholesterol    Hypertension     Past Surgical  History:  Procedure Laterality Date   CHOLECYSTECTOMY N/A 11/21/2016   Procedure: LAPAROSCOPIC CHOLECYSTECTOMY;  Surgeon: Sebastian Moles, MD;  Location: [facility] OR;  Service: General;  Laterality: N/A;   COLONOSCOPY     LAPAROSCOPIC APPENDECTOMY N/A 11/22/2023   Procedure: APPENDECTOMY, LAPAROSCOPIC;  Surgeon: Vernetta Berg, MD;  Location: [facility] ORS;  Service: General;  Laterality: N/A;    Medications: Current Outpatient Medications  Medication Sig Dispense Refill   amLODipine -benazepril  (LOTREL) 5-40 MG capsule Take 1 capsule by mouth daily. 90 capsule 3   aspirin  EC 81 MG tablet Take 1 tablet (81 mg total) by mouth every evening. 90 tablet 3   escitalopram  (LEXAPRO ) 10 MG tablet Take 1 tablet (10 mg total) by mouth daily. 30 tablet 2   famotidine  (PEPCID ) 20 MG tablet TAKE 1 TABLET BY MOUTH TWICE A DAY 180 tablet 1   montelukast  (SINGULAIR ) 10 MG tablet TAKE 1 TABLET BY MOUTH EVERY DAY IN THE EVENING 90 tablet 1   oxyCODONE  (OXY IR/ROXICODONE ) 5 MG immediate release tablet Take 1 tablet (5 mg total) by mouth every 6 (six) hours as needed for moderate pain (pain score 4-6) or severe pain (pain score 7-10). 25 tablet 0   QUEtiapine  (SEROQUEL ) 25 MG tablet Take 1 tablet (25 mg total) by mouth at bedtime. 30 tablet 2   rosuvastatin  (CRESTOR ) 40 MG tablet Take 1 tablet (40 mg total) by mouth daily. 90 tablet 3   No current facility-administered medications for this visit.    Allergies  Allergen Reactions   Bee Venom     swelling   No Known Allergies    Initial Session: Patient was last seen in [place]. He retired from [employer] in May. Wife had been retired for 4 years and he felt the timing was good to stop. Wife was having some cognitive problems and she has been diagnosed with early onset Alzheimer's. He said she is doing well and does mind exercises every day and is staying active. She will have more extensive testing in the coming weeks.  Also, Jessejames's 26 year old mother, who lives in a  retirement facility, woke up and was completely disoriented. They think she had a TIA. She is  still able to be independent. He states that he is trying to adjust to retirement. He finds he has high anxiety and feels some depression. Tries to stay busy. His hope is to keep wife in the house as long as possible. His oldest daughter from first marriage is getting next Friday. She and boyfriend broke up but then got back together 8-9 months ago. Tracy Smith wanted to talk with the guy, but he hasn't reached out to [employer]. She has not asked Tracy Smith to come to the destination wedding. His other daughter will not talk with him. He says he has depressive feeling with all of the negative things happening in his life. Has few hobbies and few interests. Wife has 1 daughter with 2 kids and Tyjai and his wife are involved. Daiel is feeling bad that he is not being respected by family. He feels bad that he lashes out verbally at his wife. He is feeling very lonely and has not developed a social network. His days are not structured and he feels a little lost. The marital relationship is good, but is not sufficient to be fulfilling. His level of gratification is minimal. He says that he reflects on his life and questions what he has accomplished and is now looking at a future of taking care of his wife and losing his mother. He feels anger about what the future holds and is disappointed about what he is facing. This contributes to feeling anxious related to all of the uncertainty. Outlets are working out, house chores, hanging with grandchildren, gun range. These do not fill his time and are mostly individual activities. He does not drink and therefore doesn't go drinking with the guys.  He is seeking counseling to best learn how to navigate these difficult times. He wants strategies to have a more fulfilling and gratifying life.        Goals/Treatment Plan: Patient is seeking couseling to facilitate adjustment to numerous life changes,  including wife's illness, mother's illness, retirement, strained relationships (daughters, brother). Seeking to reduce symptoms of both anxiety and depression. Does not want to consider medication. Will explore if necessary. Will utilize outpatient psychotherapy to explore FOO issues and develop appropriate behavioral strategies. Will also utilize insight oriented therapy. Revised goal date for resolution is 12-25. Diagnoses:  Adjustment Disorder with Anxiety and Depression.  Plan of Care: Outpatient psychotherapy  Session notes: Bill says he had a good couple of weeks. Brother wants to move their mother to another home because he feels she is not getting adequate care. He did make the move with her on the first of August. Shermon feels okay about this change, recognizing that he is limited in what he can do. Relationship with brother is stable. Kathryn states he is more settled with the family dynamic, and claims not to have animosity. He says that his sister will not communicate with their brother, because she claims he is toxic. Josejulian says he is not as edgy lately and handling things at home better. He says he is still holding out hope that he can reconnect with kids at the holidays.                                                [name], PhD  8:40a-9:30a 50 minutes

## 2024-01-02 ENCOUNTER — Ambulatory Visit (INDEPENDENT_AMBULATORY_CARE_PROVIDER_SITE_OTHER): Admitting: Family Medicine

## 2024-01-02 ENCOUNTER — Encounter: Payer: Self-pay | Admitting: Family Medicine

## 2024-01-02 VITALS — BP 122/74 | HR 77 | Temp 98.0°F | Resp 16 | Ht 68.0 in | Wt 224.0 lb

## 2024-01-02 DIAGNOSIS — B351 Tinea unguium: Secondary | ICD-10-CM

## 2024-01-02 NOTE — Patient Instructions (Signed)
 Consider Vick's Vaporub or Penlac daily to help treat this. This is not mandatory.   Let us  know if you need anything.

## 2024-01-02 NOTE — Progress Notes (Signed)
 Chief Complaint  Patient presents with   Nail Problem    Toe Nail Problems    Tracy Smith is a 68 y.o. male here for a skin complaint.  Duration: 1.5 months Location: L great toenail Pruritic? No Painful? No Drainage? No New soaps/lotions/topicals/detergents? No Trauma? No Other associated symptoms: thickening/whitening of nail Therapies tried thus far: gets pedicures daily  Past Medical History:  Diagnosis Date   Allergy    High cholesterol    Hypertension     BP 122/74 (BP Location: Left Arm, Patient Position: Sitting)   Pulse 77   Temp 98 F (36.7 C) (Oral)   Resp 16   Ht 5' 8 (1.727 m)   Wt 224 lb (101.6 kg)   SpO2 96%   BMI 34.06 kg/m  Gen: awake, alert, appearing stated age Lungs: No accessory muscle use Skin: Over the medial portion of the nail plate on the first digit left foot, there is hyperkeratinization and what appears to be onycholysis. No drainage, erythema, TTP, fluctuance, excoriation Psych: Age appropriate judgment and insight  Onychomycosis  Discussed treatment options.  He will consider watchful waiting versus a topical like Vicks VapoRub or Penlac.  He will let me know if he changes his mind about an oral medication. F/u prn. The patient voiced understanding and agreement to the plan.  Mabel Mt [place], DO 01/02/24 3:01 PM

## 2024-01-18 ENCOUNTER — Encounter: Payer: Self-pay | Admitting: Behavioral Health

## 2024-01-18 ENCOUNTER — Telehealth: Admitting: Behavioral Health

## 2024-01-18 DIAGNOSIS — F411 Generalized anxiety disorder: Secondary | ICD-10-CM | POA: Diagnosis not present

## 2024-01-18 DIAGNOSIS — F331 Major depressive disorder, recurrent, moderate: Secondary | ICD-10-CM | POA: Diagnosis not present

## 2024-01-18 DIAGNOSIS — F4323 Adjustment disorder with mixed anxiety and depressed mood: Secondary | ICD-10-CM

## 2024-01-18 DIAGNOSIS — F5105 Insomnia due to other mental disorder: Secondary | ICD-10-CM | POA: Diagnosis not present

## 2024-01-18 DIAGNOSIS — F99 Mental disorder, not otherwise specified: Secondary | ICD-10-CM | POA: Diagnosis not present

## 2024-01-18 MED ORDER — ESCITALOPRAM OXALATE 10 MG PO TABS: 10.0000 mg | ORAL_TABLET | Freq: Every day | ORAL | 1 refills | Status: AC

## 2024-01-18 MED ORDER — QUETIAPINE FUMARATE 25 MG PO TABS
25.0000 mg | ORAL_TABLET | Freq: Every day | ORAL | 1 refills | Status: AC
Start: 2024-01-18 — End: ?

## 2024-01-18 NOTE — Progress Notes (Signed)
 Tracy Smith [id number] 1956-05-09 68 y.o.  Virtual Visit via Video Note  I connected with pt @ on 01/18/24 at  9:00 AM EDT by a video enabled telemedicine application and verified that I am speaking with the correct person using two identifiers.   I discussed the limitations of evaluation and management by telemedicine and the availability of in person appointments. The patient expressed understanding and agreed to proceed.  I discussed the assessment and treatment plan with the patient. The patient was provided an opportunity to ask questions and all were answered. The patient agreed with the plan and demonstrated an understanding of the instructions.   The patient was advised to call back or seek an in-person evaluation if the symptoms worsen or if the condition fails to improve as anticipated.  I provided 30 minutes of non-face-to-face time during this encounter.  The patient was located at home.  The provider was located at [facility].   Redell DELENA Pizza, NP   Subjective:   Patient ID:  Tracy Smith is a 67 y.o. (DOB [birth year]) male.  Chief Complaint: No chief complaint on file.   HPI [place], 68 year old male presents to this office via video visit for follow up and medication management. Collateral information should be considered reliable.  He is very friendly and cooperative.  Says that medications are working well. Feels much more since of calm. Continuing to work through marriage challenges the best he can.  Says anxiety today is 2/10, and depression 2/10. Sleep is good getting about 7-8 hours per night.  Appetite is good. He is working out again. He denies history of mania, no psychosis, no auditory or visual hallucinations.  Denies previous hospitalizations.  Follows up with PCP and other specialist regularly.  Patient states that he feels safe and verbally contracts for safety with this Clinical research associate.  Has strong family support from his spouse.   No prior psychiatric  medication trials       Review of Systems:  Review of Systems  Constitutional: Negative.   Allergic/Immunologic: Negative.   Neurological: Negative.   Psychiatric/Behavioral:  Positive for dysphoric mood.     Medications: I have reviewed the patient's current medications.  Current Outpatient Medications  Medication Sig Dispense Refill   amLODipine -benazepril  (LOTREL) 5-40 MG capsule Take 1 capsule by mouth daily. 90 capsule 3   aspirin  EC 81 MG tablet Take 1 tablet (81 mg total) by mouth every evening. 90 tablet 3   escitalopram  (LEXAPRO ) 10 MG tablet Take 1 tablet (10 mg total) by mouth daily. 30 tablet 2   famotidine  (PEPCID ) 20 MG tablet TAKE 1 TABLET BY MOUTH TWICE A DAY 180 tablet 1   montelukast  (SINGULAIR ) 10 MG tablet TAKE 1 TABLET BY MOUTH EVERY DAY IN THE EVENING 90 tablet 1   QUEtiapine  (SEROQUEL ) 25 MG tablet Take 1 tablet (25 mg total) by mouth at bedtime. 30 tablet 2   rosuvastatin  (CRESTOR ) 40 MG tablet Take 1 tablet (40 mg total) by mouth daily. 90 tablet 3   No current facility-administered medications for this visit.    Medication Side Effects: None  Allergies:  Allergies  Allergen Reactions   Bee Venom     swelling   No Known Allergies     Past Medical History:  Diagnosis Date   Allergy    High cholesterol    Hypertension     Family History  Problem Relation Age of Onset   Heart attack Father    High blood pressure Father  Heart disease Father    Colon cancer Neg Hx    Colon polyps Neg Hx    Esophageal cancer Neg Hx    Rectal cancer Neg Hx    Stomach cancer Neg Hx     Social History   Socioeconomic History   Marital status: Married    Spouse name: Doris   Number of children: 3   Years of education: 17   Highest education level: Bachelor's degree (e.g., BA, AB, BS)  Occupational History   Not on file  Tobacco Use   Smoking status: Some Days    Types: Cigars   Smokeless tobacco: Never  Vaping Use   Vaping status: Never Used   Substance and Sexual Activity   Alcohol use: Yes    Comment: occ   Drug use: No   Sexual activity: Yes  Other Topics Concern   Not on file  Social History Narrative   Lives with wife in [place]. Enjoys watching and going to sporting events. Likes buying and selling    Sports cars.    Social Drivers of Corporate investment banker Strain: Low Risk  (10/02/2023)   Overall Financial Resource Strain (CARDIA)    Difficulty of Paying Living Expenses: Not hard at all  Food Insecurity: No Food Insecurity (10/02/2023)   Hunger Vital Sign    Worried About Running Out of Food in the Last Year: Never true    Ran Out of Food in the Last Year: Never true  Transportation Needs: No Transportation Needs (10/02/2023)   PRAPARE - Administrator, Civil Service (Medical): No    Lack of Transportation (Non-Medical): No  Physical Activity: Sufficiently Active (10/02/2023)   Exercise Vital Sign    Days of Exercise per Week: 5 days    Minutes of Exercise per Session: 30 min  Stress: No Stress Concern Present (10/02/2023)   [employer] of Occupational Health - Occupational Stress Questionnaire    Feeling of Stress : Not at all  Social Connections: Socially Integrated (10/02/2023)   Social Connection and Isolation Panel    Frequency of Communication with Friends and Family: More than three times a week    Frequency of Social Gatherings with Friends and Family: Three times a week    Attends Religious Services: More than 4 times per year    Active Member of Clubs or Organizations: Yes    Attends Banker Meetings: 1 to 4 times per year    Marital Status: Married  Catering manager Violence: Not At Risk (10/02/2023)   Humiliation, Afraid, Rape, and Kick questionnaire    Fear of Current or Ex-Partner: No    Emotionally Abused: No    Physically Abused: No    Sexually Abused: No    Past Medical History, Surgical history, Social history, and Family history were reviewed and  updated as appropriate.   Please see review of systems for further details on the patient's review from today.   Objective:   Physical Exam:  There were no vitals taken for this visit.  Physical Exam Constitutional:      General: He is not in acute distress.    Appearance: Normal appearance.  Neurological:     Mental Status: He is alert and oriented to person, place, and time.     Gait: Gait normal.  Psychiatric:        Attention and Perception: Attention and perception normal. He does not perceive auditory or visual hallucinations.  Mood and Affect: Mood and affect normal. Mood is not anxious or depressed. Affect is not labile.        Speech: Speech normal.        Behavior: Behavior normal. Behavior is cooperative.        Thought Content: Thought content normal.        Cognition and Memory: Cognition and memory normal.        Judgment: Judgment normal.     Lab Review:     Component Value Date/Time   NA 140 11/22/2023 0919   K 4.5 11/22/2023 0919   CL 102 11/22/2023 0919   CO2 25 11/22/2023 0919   GLUCOSE 123 (H) 11/22/2023 0919   BUN 20 11/22/2023 0919   CREATININE 1.15 11/22/2023 0919   CALCIUM  9.3 11/22/2023 0919   PROT 7.1 11/22/2023 0919   ALBUMIN 4.6 11/22/2023 0919   AST 26 11/22/2023 0919   ALT 28 11/22/2023 0919   ALKPHOS 60 11/22/2023 0919   BILITOT 1.0 11/22/2023 0919   GFRNONAA >60 11/22/2023 0919   GFRAA >60 11/21/2016 0536       Component Value Date/Time   WBC 15.8 (H) 11/22/2023 0919   RBC 5.06 11/22/2023 0919   HGB 16.2 11/22/2023 0919   HCT 45.7 11/22/2023 0919   PLT 189 11/22/2023 0919   MCV 90.3 11/22/2023 0919   MCH 32.0 11/22/2023 0919   MCHC 35.4 11/22/2023 0919   RDW 12.8 11/22/2023 0919   LYMPHSABS 2.9 11/22/2023 0919   MONOABS 1.4 (H) 11/22/2023 0919   EOSABS 0.1 11/22/2023 0919   BASOSABS 0.0 11/22/2023 0919    No results found for: POCLITH, LITHIUM   No results found for: PHENYTOIN, PHENOBARB, VALPROATE,  CBMZ   .res Assessment: Plan:     Greater than 50% of 30 min video visit  time with patient was spent on counseling and coordination of care. We discussed his good stability since last visit. Discussed continue complex relationship and medical challenges  with spouse.  He agreed today to:   To continue Lexapro  10 mg daily after breakfast To continue Seroquel  25 mg at bedtime for sleep, racing thoughts, and anxiety. Will report side effects and worsening symptoms promptly Provided emergency contact information Will follow-up in 12 weeks to reassess Reviewed PDMP   Redell A. Khaza Blansett, NP   Diagnoses and all orders for this visit:  Major depressive disorder, recurrent episode, moderate (HCC)  Generalized anxiety disorder  Insomnia due to other mental disorder  Adjustment disorder with mixed anxiety and depressed mood     Please see After Visit Summary for patient specific instructions.  Future Appointments  Date Time Provider Department Center  10/07/2024 11:30 AM [facility] ANNUAL WELLNESS VISIT 2 [facility] [address]    No orders of the defined types were placed in this encounter.     -------------------------------

## 2024-03-19 DIAGNOSIS — D235 Other benign neoplasm of skin of trunk: Secondary | ICD-10-CM | POA: Diagnosis not present

## 2024-03-19 DIAGNOSIS — L720 Epidermal cyst: Secondary | ICD-10-CM | POA: Diagnosis not present

## 2024-03-19 DIAGNOSIS — L738 Other specified follicular disorders: Secondary | ICD-10-CM | POA: Diagnosis not present

## 2024-03-19 DIAGNOSIS — D485 Neoplasm of uncertain behavior of skin: Secondary | ICD-10-CM | POA: Diagnosis not present

## 2024-03-21 ENCOUNTER — Other Ambulatory Visit: Payer: Self-pay | Admitting: Family Medicine

## 2024-04-22 ENCOUNTER — Other Ambulatory Visit: Payer: Self-pay | Admitting: Family Medicine

## 2024-04-22 ENCOUNTER — Telehealth: Admitting: Behavioral Health

## 2024-04-22 ENCOUNTER — Encounter: Payer: Self-pay | Admitting: Behavioral Health

## 2024-04-22 DIAGNOSIS — F32A Depression, unspecified: Secondary | ICD-10-CM | POA: Diagnosis not present

## 2024-04-22 DIAGNOSIS — F5105 Insomnia due to other mental disorder: Secondary | ICD-10-CM

## 2024-04-22 DIAGNOSIS — F411 Generalized anxiety disorder: Secondary | ICD-10-CM

## 2024-04-22 DIAGNOSIS — F4323 Adjustment disorder with mixed anxiety and depressed mood: Secondary | ICD-10-CM

## 2024-04-22 DIAGNOSIS — F419 Anxiety disorder, unspecified: Secondary | ICD-10-CM | POA: Diagnosis not present

## 2024-04-22 NOTE — Progress Notes (Signed)
 Tracy Smith [id number] 04-Jun-1955 68 y.o.  Virtual Visit via Video Note  I connected with pt @ on 04/22/24 at  8:00 AM EST by a video enabled telemedicine application and verified that I am speaking with the correct person using two identifiers.   I discussed the limitations of evaluation and management by telemedicine and the availability of in person appointments. The patient expressed understanding and agreed to proceed.  I discussed the assessment and treatment plan with the patient. The patient was provided an opportunity to ask questions and all were answered. The patient agreed with the plan and demonstrated an understanding of the instructions.   The patient was advised to call back or seek an in-person evaluation if the symptoms worsen or if the condition fails to improve as anticipated.  I provided 30 minutes of non-face-to-face time during this encounter.  The patient was located at home.  The provider was located at [facility].   Tracy DELENA Pizza, NP   Subjective:   Patient ID:  Tracy Smith is a 68 y.o. (DOB 1955-10-01) male.  Chief Complaint:  Chief Complaint  Patient presents with   Depression   Anxiety   Follow-up   Medication Refill   Patient Education    HPI  [place], 68 year old male presents to this office via video visit for follow up and medication management. Collateral information should be considered reliable. No significant psychosocial changes this visit.  He is very friendly and cooperative.  Says that medications are working well. Feels much more since of calm. Continuing to work through marriage challenges the best he can.  Says anxiety today is 2/10, and depression 2/10. Sleep is good getting about 7-8 hours per night.  Appetite is good. He is working out again. He denies history of mania, no psychosis, no auditory or visual hallucinations.  Denies previous hospitalizations.  Follows up with PCP and other specialist regularly.  Patient  states that he feels safe and verbally contracts for safety with this clinical research associate.  Has strong family support from his spouse.   No prior psychiatric medication trials   Review of Systems:  Review of Systems  Constitutional: Negative.   Allergic/Immunologic: Negative.   Neurological: Negative.   Psychiatric/Behavioral:  Positive for dysphoric mood.     Medications: I have reviewed the patient's current medications.  Current Outpatient Medications  Medication Sig Dispense Refill   amLODipine -benazepril  (LOTREL) 5-40 MG capsule TAKE 1 CAPSULE BY MOUTH EVERY DAY 90 capsule 3   aspirin  EC 81 MG tablet Take 1 tablet (81 mg total) by mouth every evening. 90 tablet 3   escitalopram  (LEXAPRO ) 10 MG tablet Take 1 tablet (10 mg total) by mouth daily. 90 tablet 1   famotidine  (PEPCID ) 20 MG tablet TAKE 1 TABLET BY MOUTH TWICE A DAY 180 tablet 1   montelukast  (SINGULAIR ) 10 MG tablet Take 1 tablet (10 mg total) by mouth at bedtime. 90 tablet 1   QUEtiapine  (SEROQUEL ) 25 MG tablet Take 1 tablet (25 mg total) by mouth at bedtime. 90 tablet 1   rosuvastatin  (CRESTOR ) 40 MG tablet Take 1 tablet (40 mg total) by mouth daily. 90 tablet 3   No current facility-administered medications for this visit.    Medication Side Effects: None  Allergies:  Allergies  Allergen Reactions   Bee Venom     swelling   No Known Allergies     Past Medical History:  Diagnosis Date   Allergy    High cholesterol    Hypertension  Family History  Problem Relation Age of Onset   Heart attack Father    High blood pressure Father    Heart disease Father    Colon cancer Neg Hx    Colon polyps Neg Hx    Esophageal cancer Neg Hx    Rectal cancer Neg Hx    Stomach cancer Neg Hx     Social History   Socioeconomic History   Marital status: Married    Spouse name: Doris   Number of children: 3   Years of education: 17   Highest education level: Bachelor's degree (e.g., BA, AB, BS)  Occupational History    Not on file  Tobacco Use   Smoking status: Some Days    Types: Cigars   Smokeless tobacco: Never  Vaping Use   Vaping status: Never Used  Substance and Sexual Activity   Alcohol use: Yes    Comment: occ   Drug use: No   Sexual activity: Yes  Other Topics Concern   Not on file  Social History Narrative   Lives with wife in [place]. Enjoys watching and going to sporting events. Likes buying and selling    Sports cars.    Social Drivers of Corporate Investment Banker Strain: Low Risk  (10/02/2023)   Overall Financial Resource Strain (CARDIA)    Difficulty of Paying Living Expenses: Not hard at all  Food Insecurity: No Food Insecurity (10/02/2023)   Hunger Vital Sign    Worried About Running Out of Food in the Last Year: Never true    Ran Out of Food in the Last Year: Never true  Transportation Needs: No Transportation Needs (10/02/2023)   PRAPARE - Administrator, Civil Service (Medical): No    Lack of Transportation (Non-Medical): No  Physical Activity: Sufficiently Active (10/02/2023)   Exercise Vital Sign    Days of Exercise per Week: 5 days    Minutes of Exercise per Session: 30 min  Stress: No Stress Concern Present (10/02/2023)   [employer] of Occupational Health - Occupational Stress Questionnaire    Feeling of Stress : Not at all  Social Connections: Socially Integrated (10/02/2023)   Social Connection and Isolation Panel    Frequency of Communication with Friends and Family: More than three times a week    Frequency of Social Gatherings with Friends and Family: Three times a week    Attends Religious Services: More than 4 times per year    Active Member of Clubs or Organizations: Yes    Attends Banker Meetings: 1 to 4 times per year    Marital Status: Married  Catering Manager Violence: Not At Risk (10/02/2023)   Humiliation, Afraid, Rape, and Kick questionnaire    Fear of Current or Ex-Partner: No    Emotionally Abused: No     Physically Abused: No    Sexually Abused: No    Past Medical History, Surgical history, Social history, and Family history were reviewed and updated as appropriate.   Please see review of systems for further details on the patient's review from today.   Objective:   Physical Exam:  There were no vitals taken for this visit.  Physical Exam Constitutional:      General: He is not in acute distress.    Appearance: Normal appearance.  Neurological:     Mental Status: He is alert and oriented to person, place, and time.     Gait: Gait normal.  Psychiatric:  Attention and Perception: Attention and perception normal. He does not perceive auditory or visual hallucinations.        Mood and Affect: Mood and affect normal. Mood is not anxious or depressed. Affect is not labile.        Speech: Speech normal.        Behavior: Behavior normal. Behavior is cooperative.        Thought Content: Thought content normal.        Cognition and Memory: Cognition and memory normal.        Judgment: Judgment normal.     Lab Review:     Component Value Date/Time   NA 140 11/22/2023 0919   K 4.5 11/22/2023 0919   CL 102 11/22/2023 0919   CO2 25 11/22/2023 0919   GLUCOSE 123 (H) 11/22/2023 0919   BUN 20 11/22/2023 0919   CREATININE 1.15 11/22/2023 0919   CALCIUM  9.3 11/22/2023 0919   PROT 7.1 11/22/2023 0919   ALBUMIN 4.6 11/22/2023 0919   AST 26 11/22/2023 0919   ALT 28 11/22/2023 0919   ALKPHOS 60 11/22/2023 0919   BILITOT 1.0 11/22/2023 0919   GFRNONAA >60 11/22/2023 0919   GFRAA >60 11/21/2016 0536       Component Value Date/Time   WBC 15.8 (H) 11/22/2023 0919   RBC 5.06 11/22/2023 0919   HGB 16.2 11/22/2023 0919   HCT 45.7 11/22/2023 0919   PLT 189 11/22/2023 0919   MCV 90.3 11/22/2023 0919   MCH 32.0 11/22/2023 0919   MCHC 35.4 11/22/2023 0919   RDW 12.8 11/22/2023 0919   LYMPHSABS 2.9 11/22/2023 0919   MONOABS 1.4 (H) 11/22/2023 0919   EOSABS 0.1 11/22/2023 0919    BASOSABS 0.0 11/22/2023 0919    No results found for: POCLITH, LITHIUM   No results found for: PHENYTOIN, PHENOBARB, VALPROATE, CBMZ   .res Assessment: Plan:    Greater than 50% of 30 min video visit  time with patient was spent on counseling and coordination of care. We discussed his good stability since last visit. Discussed continue complex relationship and medical challenges  with spouse. She is experiencing some increased decline. He is learning new ways to be proactive with his stress and coping skills. Content with life overall.    He agreed today to:   To continue Lexapro  10 mg daily after breakfast To continue Seroquel  25 mg at bedtime for sleep, racing thoughts, and anxiety. Will report side effects and worsening symptoms promptly Provided emergency contact information Will follow-up in 12 weeks to reassess Reviewed PDMP     Tracy A. Chayanne Filippi, NP     There are no diagnoses linked to this encounter.   Please see After Visit Summary for patient specific instructions.  Future Appointments  Date Time Provider Department Center  10/07/2024 11:30 AM [facility] ANNUAL WELLNESS VISIT 2 [facility] [address]    No orders of the defined types were placed in this encounter.     -------------------------------

## 2024-05-04 ENCOUNTER — Other Ambulatory Visit: Payer: Self-pay | Admitting: Family Medicine

## 2024-07-21 ENCOUNTER — Telehealth: Admitting: Behavioral Health

## 2024-10-07 ENCOUNTER — Ambulatory Visit

## 2024-10-08 ENCOUNTER — Ambulatory Visit
